# Patient Record
Sex: Female | Born: 1945 | Race: White | Hispanic: No | Marital: Married | Smoking: Never smoker
Health system: Southern US, Community
[De-identification: ages and names within clinical notes are randomized; demographics above are authoritative.]

## PROBLEM LIST (undated history)

## (undated) DIAGNOSIS — N6019 Diffuse cystic mastopathy of unspecified breast: Secondary | ICD-10-CM

## (undated) DIAGNOSIS — M810 Age-related osteoporosis without current pathological fracture: Secondary | ICD-10-CM

## (undated) DIAGNOSIS — G629 Polyneuropathy, unspecified: Secondary | ICD-10-CM

## (undated) DIAGNOSIS — R195 Other fecal abnormalities: Secondary | ICD-10-CM

## (undated) DIAGNOSIS — E041 Nontoxic single thyroid nodule: Secondary | ICD-10-CM

## (undated) DIAGNOSIS — I8390 Asymptomatic varicose veins of unspecified lower extremity: Secondary | ICD-10-CM

## (undated) DIAGNOSIS — R3129 Other microscopic hematuria: Secondary | ICD-10-CM

## (undated) DIAGNOSIS — I776 Arteritis, unspecified: Secondary | ICD-10-CM

## (undated) DIAGNOSIS — L280 Lichen simplex chronicus: Secondary | ICD-10-CM

## (undated) DIAGNOSIS — I499 Cardiac arrhythmia, unspecified: Secondary | ICD-10-CM

## (undated) HISTORY — PX: VENOUS ABLATION: SHX2656

## (undated) HISTORY — PX: COLONOSCOPY: SHX174

## (undated) HISTORY — PX: OTHER SURGICAL HISTORY: SHX169

---

## 2004-06-04 ENCOUNTER — Ambulatory Visit: Payer: Self-pay | Admitting: Internal Medicine

## 2004-08-13 ENCOUNTER — Ambulatory Visit: Payer: Self-pay | Admitting: Gastroenterology

## 2004-08-14 ENCOUNTER — Ambulatory Visit: Payer: Self-pay | Admitting: Urology

## 2004-08-16 ENCOUNTER — Ambulatory Visit: Payer: Self-pay | Admitting: Urology

## 2004-08-26 ENCOUNTER — Ambulatory Visit: Payer: Self-pay | Admitting: Urology

## 2004-09-26 ENCOUNTER — Ambulatory Visit: Payer: Self-pay | Admitting: Urology

## 2005-06-26 ENCOUNTER — Ambulatory Visit: Payer: Self-pay | Admitting: Internal Medicine

## 2006-07-01 ENCOUNTER — Ambulatory Visit: Payer: Self-pay | Admitting: Internal Medicine

## 2007-07-13 ENCOUNTER — Ambulatory Visit: Payer: Self-pay | Admitting: Internal Medicine

## 2008-08-18 ENCOUNTER — Ambulatory Visit: Payer: Self-pay | Admitting: Internal Medicine

## 2009-08-20 ENCOUNTER — Ambulatory Visit: Payer: Self-pay | Admitting: Internal Medicine

## 2010-09-04 ENCOUNTER — Ambulatory Visit: Payer: Self-pay | Admitting: Internal Medicine

## 2011-09-05 ENCOUNTER — Ambulatory Visit: Payer: Self-pay | Admitting: Internal Medicine

## 2012-09-09 ENCOUNTER — Ambulatory Visit: Payer: Self-pay | Admitting: Internal Medicine

## 2013-09-22 ENCOUNTER — Ambulatory Visit: Payer: Self-pay | Admitting: Internal Medicine

## 2013-10-04 ENCOUNTER — Ambulatory Visit: Payer: Self-pay | Admitting: Internal Medicine

## 2014-01-10 ENCOUNTER — Ambulatory Visit: Payer: Self-pay | Admitting: Urology

## 2014-02-20 ENCOUNTER — Ambulatory Visit: Payer: Self-pay | Admitting: Urology

## 2014-12-07 ENCOUNTER — Other Ambulatory Visit: Payer: Self-pay | Admitting: Internal Medicine

## 2014-12-07 DIAGNOSIS — Z1231 Encounter for screening mammogram for malignant neoplasm of breast: Secondary | ICD-10-CM

## 2014-12-14 ENCOUNTER — Ambulatory Visit: Payer: Self-pay

## 2014-12-20 ENCOUNTER — Ambulatory Visit
Admission: RE | Admit: 2014-12-20 | Discharge: 2014-12-20 | Disposition: A | Discharge status: Home / Self Care | Payer: BC Managed Care – PPO | Source: Ambulatory Visit | Attending: Internal Medicine | Admitting: Internal Medicine

## 2014-12-20 DIAGNOSIS — Z1231 Encounter for screening mammogram for malignant neoplasm of breast: Secondary | ICD-10-CM | POA: Diagnosis not present

## 2015-05-17 ENCOUNTER — Encounter: Payer: Self-pay | Admitting: *Deleted

## 2015-05-18 ENCOUNTER — Encounter: Payer: Self-pay | Admitting: *Deleted

## 2015-05-18 ENCOUNTER — Ambulatory Visit: Payer: BC Managed Care – PPO | Admitting: Registered Nurse

## 2015-05-18 ENCOUNTER — Ambulatory Visit
Admission: RE | Admit: 2015-05-18 | Discharge: 2015-05-18 | Disposition: A | Discharge status: Home / Self Care | Payer: BC Managed Care – PPO | Source: Ambulatory Visit | Attending: Gastroenterology | Admitting: Gastroenterology

## 2015-05-18 ENCOUNTER — Encounter
Admission: RE | Disposition: A | Discharge status: Home / Self Care | Payer: Self-pay | Source: Ambulatory Visit | Attending: Gastroenterology

## 2015-05-18 DIAGNOSIS — M810 Age-related osteoporosis without current pathological fracture: Secondary | ICD-10-CM | POA: Diagnosis not present

## 2015-05-18 DIAGNOSIS — Z79899 Other long term (current) drug therapy: Secondary | ICD-10-CM | POA: Diagnosis not present

## 2015-05-18 DIAGNOSIS — Z87442 Personal history of urinary calculi: Secondary | ICD-10-CM | POA: Diagnosis not present

## 2015-05-18 DIAGNOSIS — Z7982 Long term (current) use of aspirin: Secondary | ICD-10-CM | POA: Insufficient documentation

## 2015-05-18 DIAGNOSIS — Z1211 Encounter for screening for malignant neoplasm of colon: Secondary | ICD-10-CM | POA: Insufficient documentation

## 2015-05-18 HISTORY — DX: Diffuse cystic mastopathy of unspecified breast: N60.19

## 2015-05-18 HISTORY — DX: Age-related osteoporosis without current pathological fracture: M81.0

## 2015-05-18 HISTORY — DX: Lichen simplex chronicus: L28.0

## 2015-05-18 HISTORY — DX: Polyneuropathy, unspecified: G62.9

## 2015-05-18 HISTORY — DX: Other microscopic hematuria: R31.29

## 2015-05-18 HISTORY — DX: Arteritis, unspecified: I77.6

## 2015-05-18 HISTORY — PX: COLONOSCOPY WITH PROPOFOL: SHX5780

## 2015-05-18 HISTORY — DX: Asymptomatic varicose veins of unspecified lower extremity: I83.90

## 2015-05-18 HISTORY — DX: Other fecal abnormalities: R19.5

## 2015-05-18 SURGERY — COLONOSCOPY WITH PROPOFOL
Anesthesia: General

## 2015-05-18 MED ORDER — LIDOCAINE HCL (CARDIAC) 20 MG/ML IV SOLN
INTRAVENOUS | Status: DC | PRN
  Administered 2015-05-18: 40 mg via INTRAVENOUS

## 2015-05-18 MED ORDER — FENTANYL CITRATE (PF) 100 MCG/2ML IJ SOLN
INTRAMUSCULAR | Status: DC | PRN
  Administered 2015-05-18: 50 ug via INTRAVENOUS

## 2015-05-18 MED ORDER — PROPOFOL 10 MG/ML IV BOLUS
INTRAVENOUS | Status: DC | PRN
  Administered 2015-05-18: 50 mg via INTRAVENOUS

## 2015-05-18 MED ORDER — SODIUM CHLORIDE 0.9 % IV SOLN: INTRAVENOUS | Status: DC

## 2015-05-18 MED ORDER — SODIUM CHLORIDE 0.9 % IV SOLN
INTRAVENOUS | Status: DC
  Administered 2015-05-18: 08:00:00 via INTRAVENOUS

## 2015-05-18 MED ORDER — PROPOFOL 500 MG/50ML IV EMUL
INTRAVENOUS | Status: DC | PRN
  Administered 2015-05-18: 150 ug/kg/min via INTRAVENOUS

## 2015-05-18 MED ORDER — MIDAZOLAM HCL 2 MG/2ML IJ SOLN
INTRAMUSCULAR | Status: DC | PRN
  Administered 2015-05-18: 1 mg via INTRAVENOUS

## 2015-05-18 NOTE — Transfer of Care (Signed)
Immediate Anesthesia Transfer of Care Note  Patient: Megan Shepard  Procedure(s) Performed: Procedure(s): COLONOSCOPY WITH PROPOFOL (N/A)  Patient Location: PACU and Endoscopy Unit  Anesthesia Type:General  Level of Consciousness: sedated  Airway & Oxygen Therapy: Patient Spontanous Breathing and Patient connected to nasal cannula oxygen  Post-op Assessment: Report given to RN and Post -op Vital signs reviewed and stable  Post vital signs: Reviewed and stable  Last Vitals:  Filed Vitals:   05/18/15 0719 05/18/15 0825  BP: 133/62 92/59  Pulse: 65 64  Temp: 36.1 C 36.6 C  Resp: 14 13    Complications: No apparent anesthesia complications

## 2015-05-18 NOTE — Op Note (Signed)
[facility] Gastroenterology Patient Name: Megan Shepard Procedure Date: 05/18/2015 7:53 AM MRN: [id number] Account #: [id number] Date of Birth: [birth year] Admit Type: Outpatient Age: 70 Room: [facility] ENDO ROOM 4 Gender: Female Note Status: Finalized Procedure:            Colonoscopy Indications:          Screening for colorectal malignant neoplasm Providers:            Lupita Dawn. Candace Cruise, MD Referring MD:         Ramonita Lab, MD (Referring MD) Medicines:            Monitored Anesthesia Care Complications:        No immediate complications. Procedure:            Pre-Anesthesia Assessment:                       - Prior to the procedure, a History and Physical was                        performed, and patient medications, allergies and                        sensitivities were reviewed. The patient's tolerance of                        previous anesthesia was reviewed.                       - The risks and benefits of the procedure and the                        sedation options and risks were discussed with the                        patient. All questions were answered and informed                        consent was obtained.                       - After reviewing the risks and benefits, the patient                        was deemed in satisfactory condition to undergo the                        procedure.                       After obtaining informed consent, the colonoscope was                        passed under direct vision. Throughout the procedure,                        the patient's blood pressure, pulse, and oxygen                        saturations were monitored continuously. The  Colonoscope was introduced through the anus and                        advanced to the the cecum, identified by appendiceal                        orifice and ileocecal valve. The colonoscopy was                        performed without difficulty. The patient  tolerated the                        procedure well. The quality of the bowel preparation                        was good. Findings:      The colon (entire examined portion) appeared normal. Impression:           - The entire examined colon is normal.                       - No specimens collected. Recommendation:       - Discharge patient to home.                       - Repeat colonoscopy in 10 years for surveillance.                       - The findings and recommendations were discussed with                        the patient. Procedure Code(s):    --- Professional ---                       [phone], Colonoscopy, flexible; diagnostic, including                        collection of specimen(s) by brushing or washing, when                        performed (separate procedure) Diagnosis Code(s):    --- Professional ---                       Z12.11, Encounter for screening for malignant neoplasm                        of colon CPT copyright 2016 [employer]. All rights reserved. The codes documented in this report are preliminary and upon coder review may  be revised to meet current compliance requirements. Hulen Luster, MD 05/18/2015 8:22:05 AM This report has been signed electronically. Number of Addenda: 0 Note Initiated On: 05/18/2015 7:53 AM Scope Withdrawal Time: 0 hours 5 minutes 21 seconds  Total Procedure Duration: 0 hours 9 minutes 55 seconds       [facility]

## 2015-05-18 NOTE — Anesthesia Preprocedure Evaluation (Signed)
Anesthesia Evaluation  Patient identified by MRN, date of birth, ID band Patient awake    Reviewed: Allergy & Precautions, H&P , NPO status , Patient's Chart, lab work & pertinent test results, reviewed documented beta blocker date and time   Airway Mallampati: II   Neck ROM: full    Dental  (+) Teeth Intact   Pulmonary neg pulmonary ROS,    Pulmonary exam normal        Cardiovascular negative cardio ROS Normal cardiovascular exam     Neuro/Psych negative neurological ROS  negative psych ROS   GI/Hepatic negative GI ROS, Neg liver ROS,   Endo/Other  negative endocrine ROS  Renal/GU negative Renal ROS  negative genitourinary   Musculoskeletal   Abdominal   Peds  Hematology negative hematology ROS (+)   Anesthesia Other Findings Past Medical History:   Fibrocystic breast disease                                   Lichen simplex chronicus                                     Microscopic hematuria                                        Neuropathy (HCC)                                             Occult blood in stools                                       Osteoporosis                                                 Varicose vein                                                Vasculitis (HCC)                                           Past Surgical History:   COLONOSCOPY                                                   kidney stones                                                 VENOUS  ABLATION                                             BMI    Body Mass Index   22.26 kg/m 2     Reproductive/Obstetrics                             Anesthesia Physical Anesthesia Plan  ASA: III  Anesthesia Plan: General   Post-op Pain Management:    Induction:   Airway Management Planned:   Additional Equipment:   Intra-op Plan:   Post-operative Plan:   Informed Consent: I have reviewed the  patients History and Physical, chart, labs and discussed the procedure including the risks, benefits and alternatives for the proposed anesthesia with the patient or authorized representative who has indicated his/her understanding and acceptance.   Dental Advisory Given  Plan Discussed with: CRNA  Anesthesia Plan Comments:         Anesthesia Quick Evaluation

## 2015-05-18 NOTE — H&P (Signed)
    Primary Care Physician:  Adin Hector, MD Primary Gastroenterologist:  Dr. Candace Cruise  Pre-Procedure History & Physical: HPI:  Megan Shepard is a 70 y.o. female is here for an colonoscopy  Past Medical History  Diagnosis Date  . Fibrocystic breast disease   . Lichen simplex chronicus   . Microscopic hematuria   . Neuropathy ([place])   . Occult blood in stools   . Osteoporosis   . Varicose vein   . Vasculitis [facility])     Past Surgical History  Procedure Laterality Date  . Colonoscopy    . Kidney stones    . Venous ablation      Prior to Admission medications   Medication Sig Start Date End Date Taking? Authorizing Provider  acetaminophen (TYLENOL) 500 MG tablet Take 500 mg by mouth every 6 (six) hours as needed.   Yes Historical Provider, MD  aspirin 81 MG tablet Take 81 mg by mouth daily.   Yes Historical Provider, MD  calcium citrate-vitamin D (CITRACAL+D) 315-200 MG-UNIT tablet Take 1 tablet by mouth 2 (two) times daily.   Yes Historical Provider, MD  denosumab (PROLIA) 60 MG/ML SOLN injection Inject 60 mg into the skin every 6 (six) months. Administer in upper arm, thigh, or abdomen   Yes Historical Provider, MD  gabapentin (NEURONTIN) 100 MG capsule Take 100 mg by mouth 3 (three) times daily.   Yes Historical Provider, MD  magnesium oxide (MAG-OX) 400 MG tablet Take 400 mg by mouth daily.   Yes Historical Provider, MD  pentoxifylline (TRENTAL) 400 MG CR tablet Take 400 mg by mouth daily.   Yes Historical Provider, MD  vitamin C (ASCORBIC ACID) 500 MG tablet Take 1,000 mg by mouth 2 (two) times daily.   Yes Historical Provider, MD    Allergies as of 05/04/2015  . (Not on File)    Family History  Problem Relation Age of Onset  . Breast cancer Maternal Aunt     Social History   Social History  . Marital Status: Married    Spouse Name: N/A  . Number of Children: N/A  . Years of Education: N/A   Occupational History  . Not on file.   Social History Main Topics   . Smoking status: Never Smoker   . Smokeless tobacco: Never Used  . Alcohol Use: No  . Drug Use: No  . Sexual Activity: Not on file   Other Topics Concern  . Not on file   Social History Narrative    Review of Systems: See HPI, otherwise negative ROS  Physical Exam: BP 133/62 mmHg  Pulse 65  Temp(Src) 96.9 F (36.1 C) (Tympanic)  Resp 14  Ht 5' (1.524 m)  Wt 51.71 kg (114 lb)  BMI 22.26 kg/m2  SpO2 100%  LMP  (Approximate) General:   Alert,  pleasant and cooperative in NAD Head:  Normocephalic and atraumatic. Neck:  Supple; no masses or thyromegaly. Lungs:  Clear throughout to auscultation.    Heart:  Regular rate and rhythm. Abdomen:  Soft, nontender and nondistended. Normal bowel sounds, without guarding, and without rebound.   Neurologic:  Alert and  oriented x4;  grossly normal neurologically.  Impression/Plan: Megan Shepard is here for an colonoscopy to be performed for screening  Risks, benefits, limitations, and alternatives regarding  colonoscopy have been reviewed with the patient.  Questions have been answered.  All parties agreeable.   Jaselynn Tamas, Lupita Dawn, MD  05/18/2015, 8:05 AM

## 2015-05-18 NOTE — Anesthesia Procedure Notes (Signed)
Date/Time: 05/18/2015 7:53 AM Performed by: Doreen Salvage Pre-anesthesia Checklist: Patient identified, Emergency Drugs available, Suction available and Patient being monitored Patient Re-evaluated:Patient Re-evaluated prior to inductionOxygen Delivery Method: Nasal cannula Intubation Type: IV induction Dental Injury: Teeth and Oropharynx as per pre-operative assessment  Comments: Nasal cannula with etCO2 monitoring

## 2015-05-19 ENCOUNTER — Encounter: Payer: Self-pay | Admitting: Gastroenterology

## 2015-05-25 NOTE — Anesthesia Postprocedure Evaluation (Signed)
Anesthesia Post Note  Patient: Megan Shepard  Procedure(s) Performed: Procedure(s) (LRB): COLONOSCOPY WITH PROPOFOL (N/A)  Patient location during evaluation: PACU Anesthesia Type: General Level of consciousness: awake and alert Pain management: pain level controlled Vital Signs Assessment: post-procedure vital signs reviewed and stable Respiratory status: spontaneous breathing, nonlabored ventilation, respiratory function stable and patient connected to nasal cannula oxygen Cardiovascular status: blood pressure returned to baseline and stable Postop Assessment: no signs of nausea or vomiting Anesthetic complications: no    Last Vitals:  Filed Vitals:   05/18/15 0845 05/18/15 0855  BP: 100/67 106/65  Pulse: 64 59  Temp:    Resp: 15 14    Last Pain: There were no vitals filed for this visit.               Molli Barrows

## 2015-11-19 ENCOUNTER — Other Ambulatory Visit: Payer: Self-pay | Admitting: Internal Medicine

## 2015-11-19 DIAGNOSIS — Z1231 Encounter for screening mammogram for malignant neoplasm of breast: Secondary | ICD-10-CM

## 2015-12-10 DIAGNOSIS — N1832 Chronic kidney disease, stage 3b: Secondary | ICD-10-CM | POA: Insufficient documentation

## 2015-12-10 DIAGNOSIS — N183 Chronic kidney disease, stage 3 unspecified: Secondary | ICD-10-CM | POA: Insufficient documentation

## 2015-12-24 ENCOUNTER — Ambulatory Visit
Admission: RE | Admit: 2015-12-24 | Discharge: 2015-12-24 | Disposition: A | Discharge status: Home / Self Care | Payer: BC Managed Care – PPO | Source: Ambulatory Visit | Attending: Internal Medicine | Admitting: Internal Medicine

## 2015-12-24 DIAGNOSIS — Z1231 Encounter for screening mammogram for malignant neoplasm of breast: Secondary | ICD-10-CM

## 2016-11-19 ENCOUNTER — Other Ambulatory Visit: Payer: Self-pay | Admitting: Internal Medicine

## 2016-11-19 DIAGNOSIS — Z1231 Encounter for screening mammogram for malignant neoplasm of breast: Secondary | ICD-10-CM

## 2016-12-26 ENCOUNTER — Ambulatory Visit
Admission: RE | Admit: 2016-12-26 | Discharge: 2016-12-26 | Disposition: A | Discharge status: Home / Self Care | Payer: Medicare Other | Source: Ambulatory Visit | Attending: Internal Medicine | Admitting: Internal Medicine

## 2016-12-26 DIAGNOSIS — Z1231 Encounter for screening mammogram for malignant neoplasm of breast: Secondary | ICD-10-CM | POA: Diagnosis present

## 2017-04-16 ENCOUNTER — Other Ambulatory Visit: Payer: Self-pay

## 2017-04-16 ENCOUNTER — Emergency Department: Payer: Medicare Other

## 2017-04-16 ENCOUNTER — Emergency Department
Admission: EM | Admit: 2017-04-16 | Discharge: 2017-04-16 | Disposition: A | Discharge status: Home / Self Care | Payer: Medicare Other | Attending: Student in an Organized Health Care Education/Training Program | Admitting: Student in an Organized Health Care Education/Training Program

## 2017-04-16 ENCOUNTER — Encounter: Payer: Self-pay | Admitting: *Deleted

## 2017-04-16 DIAGNOSIS — R51 Headache: Secondary | ICD-10-CM | POA: Insufficient documentation

## 2017-04-16 DIAGNOSIS — R519 Headache, unspecified: Secondary | ICD-10-CM

## 2017-04-16 DIAGNOSIS — Z79899 Other long term (current) drug therapy: Secondary | ICD-10-CM | POA: Insufficient documentation

## 2017-04-16 LAB — CBC WITH DIFFERENTIAL/PLATELET
Basophils Absolute: 0.1 10*3/uL (ref 0–0.1)
Basophils Relative: 1 %
EOS ABS: 0.2 10*3/uL (ref 0–0.7)
EOS PCT: 3 %
HCT: 44.2 % (ref 35.0–47.0)
Hemoglobin: 15 g/dL (ref 12.0–16.0)
LYMPHS ABS: 1.2 10*3/uL (ref 1.0–3.6)
LYMPHS PCT: 24 %
MCH: 32 pg (ref 26.0–34.0)
MCHC: 33.9 g/dL (ref 32.0–36.0)
MCV: 94.5 fL (ref 80.0–100.0)
MONO ABS: 0.4 10*3/uL (ref 0.2–0.9)
MONOS PCT: 8 %
Neutro Abs: 3.2 10*3/uL (ref 1.4–6.5)
Neutrophils Relative %: 64 %
PLATELETS: 213 10*3/uL (ref 150–440)
RBC: 4.68 MIL/uL (ref 3.80–5.20)
RDW: 12.6 % (ref 11.5–14.5)
WBC: 5.1 10*3/uL (ref 3.6–11.0)

## 2017-04-16 LAB — BASIC METABOLIC PANEL
Anion gap: 9 (ref 5–15)
BUN: 26 mg/dL — AB (ref 6–20)
CALCIUM: 9 mg/dL (ref 8.9–10.3)
CO2: 24 mmol/L (ref 22–32)
CREATININE: 1.1 mg/dL — AB (ref 0.44–1.00)
Chloride: 109 mmol/L (ref 101–111)
GFR calc Af Amer: 58 mL/min — ABNORMAL LOW (ref >60)
GFR, EST NON AFRICAN AMERICAN: 50 mL/min — AB (ref >60)
GLUCOSE: 96 mg/dL (ref 65–99)
POTASSIUM: 3.6 mmol/L (ref 3.5–5.1)
SODIUM: 142 mmol/L (ref 135–145)

## 2017-04-16 LAB — SEDIMENTATION RATE: SED RATE: 8 mm/h (ref 0–30)

## 2017-04-16 MED ORDER — ACETAMINOPHEN 500 MG PO TABS: 1000.0000 mg | ORAL_TABLET | Freq: Once | ORAL | Status: DC

## 2017-04-16 MED ORDER — TRAMADOL HCL 50 MG PO TABS: 50.0000 mg | ORAL_TABLET | Freq: Four times a day (QID) | ORAL | 0 refills | Status: AC | PRN

## 2017-04-16 MED ORDER — ACYCLOVIR 400 MG PO TABS: 400.0000 mg | ORAL_TABLET | Freq: Every day | ORAL | 0 refills | Status: AC

## 2017-04-16 NOTE — ED Notes (Signed)
Pt denies any changes in vision, nausea/vomiting, or lightheaded/dizziness. Pt states pain started Monday and was more over the entire head. Yesterday pains seemed to become localized to the left posterior side of scalp. Pt denies any changes in sensation with palpation of effected area.

## 2017-04-16 NOTE — ED Provider Notes (Signed)
[facility] Emergency Department Provider Note    None    (approximate)  I have reviewed the triage vital signs and the nursing notes.   HISTORY  Chief Complaint Headache    HPI Megan Shepard is a 72 y.o. female presents with chief complaint of left-sided parietal headache that is intermittent and lancinating.  States that it lasts only seconds at a time.  No fevers.  No blurry vision numbness or tingling.  States that symptoms started on Sunday and have progressively coalesced into pain over the left parietal area.  Denies any trauma.  No neck stiffness.  Has never had symptoms like this before.  Does have a history of tension headache and states that this was not sudden or thunderclap headache.  Does have a history of neuropathy.  Past Medical History:  Diagnosis Date  . Fibrocystic breast disease   . Lichen simplex chronicus   . Microscopic hematuria   . Neuropathy   . Occult blood in stools   . Osteoporosis   . Varicose vein   . Vasculitis (HCC)    Family History  Problem Relation Age of Onset  . Breast cancer Maternal Aunt        80 's   Past Surgical History:  Procedure Laterality Date  . COLONOSCOPY    . COLONOSCOPY WITH PROPOFOL N/A 05/18/2015   Procedure: COLONOSCOPY WITH PROPOFOL;  Surgeon: Hulen Luster, MD;  Location: [facility] ENDOSCOPY;  Service: Gastroenterology;  Laterality: N/A;  . kidney stones    . VENOUS ABLATION     There are no active problems to display for this patient.     Prior to Admission medications   Medication Sig Start Date End Date Taking? Authorizing Provider  acetaminophen (TYLENOL) 500 MG tablet Take 500 mg by mouth every 6 (six) hours as needed.    [provider]  aspirin 81 MG tablet Take 81 mg by mouth daily.    [provider]  calcium citrate-vitamin D (CITRACAL+D) 315-200 MG-UNIT tablet Take 1 tablet by mouth 2 (two) times daily.    [provider]  denosumab (PROLIA) 60 MG/ML  SOLN injection Inject 60 mg into the skin every 6 (six) months. Administer in upper arm, thigh, or abdomen    [provider]  gabapentin (NEURONTIN) 100 MG capsule Take 100 mg by mouth 3 (three) times daily.    [provider]  magnesium oxide (MAG-OX) 400 MG tablet Take 400 mg by mouth daily.    [provider]  pentoxifylline (TRENTAL) 400 MG CR tablet Take 400 mg by mouth daily.    [provider]  vitamin C (ASCORBIC ACID) 500 MG tablet Take 1,000 mg by mouth 2 (two) times daily.    [provider]    Allergies Erythromycin    Social History Social History   Tobacco Use  . Smoking status: Never Smoker  . Smokeless tobacco: Never Used  Substance Use Topics  . Alcohol use: No  . Drug use: No    Review of Systems Patient denies headaches, rhinorrhea, blurry vision, numbness, shortness of breath, chest pain, edema, cough, abdominal pain, nausea, vomiting, diarrhea, dysuria, fevers, rashes or hallucinations unless otherwise stated above in HPI. ____________________________________________   PHYSICAL EXAM:  VITAL SIGNS: Vitals:   04/16/17 1809  BP: 138/70  Pulse: 67  Resp: 18  Temp: 98.3 F (36.8 C)  SpO2: 97%    Constitutional: Alert and oriented. Well appearing and in no acute distress. Eyes: Conjunctivae are  normal.  Head: Atraumatic. Nose: No congestion/rhinnorhea. Mouth/Throat: Mucous membranes are moist.   Neck: No stridor. Painless ROM.  Cardiovascular: Normal rate, regular rhythm. Grossly normal heart sounds.  Good peripheral circulation. Respiratory: Normal respiratory effort.  No retractions. Lungs CTAB. Gastrointestinal: Soft and nontender. No distention. No abdominal bruits. No CVA tenderness. Genitourinary:  Musculoskeletal: No lower extremity tenderness nor edema.  No joint effusions. Neurologic: CN- intact.  No facial droop, Normal FNF.  Normal heel to shin.  Sensation intact bilaterally. Normal speech and  language. No gross focal neurologic deficits are appreciated. No gait instability. Skin:  Skin is warm, dry and intact. No rash noted. Psychiatric: Mood and affect are normal. Speech and behavior are normal.  ____________________________________________   LABS (all labs ordered are listed, but only abnormal results are displayed)  Results for orders placed or performed during the hospital encounter of 04/16/17 (from the past 24 hour(s))  CBC with Differential/Platelet     Status: None   Collection Time: 04/16/17  7:44 PM  Result Value Ref Range   WBC 5.1 3.6 - 11.0 K/uL   RBC 4.68 3.80 - 5.20 MIL/uL   Hemoglobin 15.0 12.0 - 16.0 g/dL   HCT 44.2 35.0 - 47.0 %   MCV 94.5 80.0 - 100.0 fL   MCH 32.0 26.0 - 34.0 pg   MCHC 33.9 32.0 - 36.0 g/dL   RDW 12.6 11.5 - 14.5 %   Platelets 213 150 - 440 K/uL   Neutrophils Relative % 64 %   Neutro Abs 3.2 1.4 - 6.5 K/uL   Lymphocytes Relative 24 %   Lymphs Abs 1.2 1.0 - 3.6 K/uL   Monocytes Relative 8 %   Monocytes Absolute 0.4 0.2 - 0.9 K/uL   Eosinophils Relative 3 %   Eosinophils Absolute 0.2 0 - 0.7 K/uL   Basophils Relative 1 %   Basophils Absolute 0.1 0 - 0.1 K/uL  Basic metabolic panel     Status: Abnormal   Collection Time: 04/16/17  7:44 PM  Result Value Ref Range   Sodium 142 135 - 145 mmol/L   Potassium 3.6 3.5 - 5.1 mmol/L   Chloride 109 101 - 111 mmol/L   CO2 24 22 - 32 mmol/L   Glucose, Bld 96 65 - 99 mg/dL   BUN 26 (H) 6 - 20 mg/dL   Creatinine, Ser 1.10 (H) 0.44 - 1.00 mg/dL   Calcium 9.0 8.9 - 10.3 mg/dL   GFR calc non Af Amer 50 (L) >60 mL/min   GFR calc Af Amer 58 (L) >60 mL/min   Anion gap 9 5 - 15  Sedimentation rate     Status: None   Collection Time: 04/16/17  7:44 PM  Result Value Ref Range   Sed Rate 8 0 - 30 mm/hr   ____________________________________________ ____________________________________________  RADIOLOGY  I personally reviewed all radiographic images ordered to evaluate for the above acute  complaints and reviewed radiology reports and findings.  These findings were personally discussed with the patient.  Please see medical record for radiology report.  ____________________________________________   PROCEDURES  Procedure(s) performed:  Procedures    Critical Care performed: no ____________________________________________   INITIAL IMPRESSION / ASSESSMENT AND PLAN / ED COURSE  Pertinent labs & imaging results that were available during my care of the patient were reviewed by me and considered in my medical decision making (see chart for details).  DDX: neuralgia, GCA, shingles, mass, tension, migraine  Kissa Campoy is a 72 y.o. who presents to  the ED with with Hx of HA as described above. Not worst HA ever. Described as sudden, brief stabbing pains with complete resolution of symptoms between episodes. Denies focal neurologic symptoms. Denies trauma. No fevers or neck pain. No vision loss. Afebrile in ED. VSS. Exam as above. No meningeal signs. No CN, motor, sensory or cerebellar deficits. Temporal arteries palpable and non-tender. Appears well and non-toxic. No evidence of shingles or vesicles.  No clinical findings to suggest mening.  ESR suggests against GCA.  Not concistent with SAH.  Would expect pain to be persistent if SAH or aneurysmal.  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  Given her well appearance with no focal neuro deficits, stable to D/C home, follow up with PCP and Neurology if persistent recurrent Has.  Will given rx for shingles treatment incase lesions develop.  Have discussed with the patient and available family all diagnostics and treatments performed thus far and all questions were answered to the best of my ability. The patient demonstrates understanding and agreement with plan.        ____________________________________________   FINAL CLINICAL IMPRESSION(S) / ED DIAGNOSES  Final diagnoses:  Nonintractable episodic headache,  unspecified headache type      NEW MEDICATIONS STARTED DURING THIS VISIT:  New Prescriptions   No medications on file     Note:  This document was prepared using Dragon voice recognition software and may include unintentional dictation errors.    Megan Lot, MD 04/16/17 2056

## 2017-04-16 NOTE — Discharge Instructions (Signed)

## 2017-04-16 NOTE — ED Triage Notes (Signed)
Pt c/o headache x 4 days. Pt states pain is intermittent and stabbing/ Pt states scalp tenderness prior to today. Pt in no acute distress at this time, A&O x 4 and ambulatory to triage. Pt states that the pain is localized to L side of head. Pt has taken nothing for her pain.

## 2018-01-08 ENCOUNTER — Other Ambulatory Visit: Payer: Self-pay | Admitting: Internal Medicine

## 2018-01-08 DIAGNOSIS — Z1231 Encounter for screening mammogram for malignant neoplasm of breast: Secondary | ICD-10-CM

## 2018-01-14 ENCOUNTER — Ambulatory Visit
Admission: RE | Admit: 2018-01-14 | Discharge: 2018-01-14 | Disposition: A | Discharge status: Home / Self Care | Payer: Medicare Other | Source: Ambulatory Visit | Attending: Internal Medicine | Admitting: Internal Medicine

## 2018-01-14 DIAGNOSIS — Z1231 Encounter for screening mammogram for malignant neoplasm of breast: Secondary | ICD-10-CM | POA: Diagnosis not present

## 2018-02-04 ENCOUNTER — Other Ambulatory Visit: Payer: Self-pay | Admitting: Internal Medicine

## 2018-02-04 DIAGNOSIS — R311 Benign essential microscopic hematuria: Secondary | ICD-10-CM

## 2018-03-03 ENCOUNTER — Ambulatory Visit
Admission: RE | Admit: 2018-03-03 | Discharge: 2018-03-03 | Disposition: A | Discharge status: Home / Self Care | Payer: Medicare Other | Source: Ambulatory Visit | Attending: Internal Medicine | Admitting: Internal Medicine

## 2018-03-03 DIAGNOSIS — R311 Benign essential microscopic hematuria: Secondary | ICD-10-CM | POA: Diagnosis not present

## 2018-09-15 ENCOUNTER — Other Ambulatory Visit: Payer: Self-pay

## 2018-09-15 ENCOUNTER — Ambulatory Visit (INDEPENDENT_AMBULATORY_CARE_PROVIDER_SITE_OTHER): Payer: Medicare Other | Admitting: General Surgery

## 2018-09-15 ENCOUNTER — Encounter: Payer: Self-pay | Admitting: *Deleted

## 2018-09-15 ENCOUNTER — Encounter: Payer: Self-pay | Admitting: General Surgery

## 2018-09-15 VITALS — BP 137/83 | HR 77 | Temp 97.9°F | Resp 14 | Ht 59.0 in | Wt 111.0 lb

## 2018-09-15 DIAGNOSIS — L959 Vasculitis limited to the skin, unspecified: Secondary | ICD-10-CM | POA: Insufficient documentation

## 2018-09-15 DIAGNOSIS — M810 Age-related osteoporosis without current pathological fracture: Secondary | ICD-10-CM | POA: Insufficient documentation

## 2018-09-15 DIAGNOSIS — G5790 Unspecified mononeuropathy of unspecified lower limb: Secondary | ICD-10-CM | POA: Insufficient documentation

## 2018-09-15 DIAGNOSIS — L280 Lichen simplex chronicus: Secondary | ICD-10-CM | POA: Insufficient documentation

## 2018-09-15 DIAGNOSIS — R600 Localized edema: Secondary | ICD-10-CM | POA: Insufficient documentation

## 2018-09-15 DIAGNOSIS — E041 Nontoxic single thyroid nodule: Secondary | ICD-10-CM

## 2018-09-15 DIAGNOSIS — R3129 Other microscopic hematuria: Secondary | ICD-10-CM | POA: Insufficient documentation

## 2018-09-15 DIAGNOSIS — N6019 Diffuse cystic mastopathy of unspecified breast: Secondary | ICD-10-CM | POA: Insufficient documentation

## 2018-09-15 NOTE — H&P (View-Only) (Signed)
Patient ID: Megan Shepard, female   DOB: 06/17/1945, 73 y.o.   MRN: [id number]  Chief Complaint  Patient presents with  . New Patient (Initial Visit)    Thyroid    HPI Megan Shepard is a 73 y.o. female.   She was referred by Dr. Melissa Solum for evaluation of a suspicious left thyroid nodule.  Megan Shepard was followed by Dr. Solum for her osteoporosis.  On physical examination during a clinic visit, a left-sided thyroid nodule was appreciated.  Subsequent ultrasound and fine-needle aspiration biopsy were performed.  The cytopathology of the nodule was consistent with suspicious for follicular neoplasm.  Affirma testing was done and it was found to be suspicious but negative for BRAF or RET/PTC mutations.  She has been referred to discuss thyroid lobectomy.   Megan Shepard states that she would not have been aware of the nodule, had it not been found on exam.  She denies any voice changes, difficulty swallowing, frequent throat clearing, or pressure sensation in the neck while supine.  She does endorse hand tremors, but states that they are not always present.  She denies heat or cold intolerance.  She does not have palpitations.  There have been no changes in the texture of her hair, skin, or fingernails.  She describes chronic constipation.  She has not had any significant weight changes.  She has no history of occupational exposure to ionizing radiation nor any therapeutic head or neck irradiation.  She says that her mother took thyroid hormone replacement, but she is not aware as to why.  She thinks her daughter may be hyperthyroid, but is not aware of a specific diagnosis.  There is no other family history of thyroid cancer or other thyroid abnormalities.   Past Medical History:  Diagnosis Date  . Fibrocystic breast disease   . Lichen simplex chronicus   . Microscopic hematuria   . Neuropathy   . Occult blood in stools   . Osteoporosis   . Varicose vein   . Vasculitis (HCC)      Past Surgical History:  Procedure Laterality Date  . COLONOSCOPY    . COLONOSCOPY WITH PROPOFOL N/A 05/18/2015   Procedure: COLONOSCOPY WITH PROPOFOL;  Surgeon: Paul Y Oh, MD;  Location: [facility] ENDOSCOPY;  Service: Gastroenterology;  Laterality: N/A;  . kidney stones    . VENOUS ABLATION      Family History  Problem Relation Age of Onset  . Breast cancer Maternal Aunt        80's    Social History Social History   Tobacco Use  . Smoking status: Never Smoker  . Smokeless tobacco: Never Used  Substance Use Topics  . Alcohol use: No  . Drug use: No    Allergies  Allergen Reactions  . Erythromycin     Current Outpatient Medications  Medication Sig Dispense Refill  . alendronate (FOSAMAX) 70 MG tablet Take 70 mg by mouth once a week. Take with a full glass of water on an empty stomach.    . aspirin 81 MG tablet Take 81 mg by mouth daily.    . calcium citrate-vitamin D (CITRACAL+D) 315-200 MG-UNIT tablet Take 1 tablet by mouth 2 (two) times daily.    . magnesium oxide (MAG-OX) 400 MG tablet Take 400 mg by mouth daily.    . vitamin C (ASCORBIC ACID) 500 MG tablet Take 1,000 mg by mouth 2 (two) times daily.    . acetaminophen (TYLENOL) 500 MG tablet Take 500 mg by mouth every   6 (six) hours as needed.     No current facility-administered medications for this visit.     Review of Systems Review of Systems  All other systems reviewed and are negative.   Blood pressure 137/83, pulse 77, temperature 97.9 F (36.6 C), temperature source Temporal, resp. rate 14, height 4' 11" (1.499 m), weight 111 lb (50.3 kg), SpO2 99 %.  Physical Exam Physical Exam Constitutional:      General: She is not in acute distress.    Appearance: Normal appearance.  HENT:     Head: Normocephalic and atraumatic.     Nose:     Comments: Covered with a mask secondary to COVID-19 precautions    Mouth/Throat:     Comments: Covered with a mask secondary to COVID-19 precautions Eyes:      General: No scleral icterus.       Right eye: No discharge.        Left eye: No discharge.     Extraocular Movements: Extraocular movements intact.     Comments: No proptosis or exophthalmos  Neck:     Musculoskeletal: Normal range of motion. No neck rigidity.     Comments: There is an approximately 2 cm nodule palpated in the left lobe of the thyroid.  It moves freely with deglutition. Cardiovascular:     Rate and Rhythm: Normal rate and regular rhythm.     Heart sounds: No murmur.  Pulmonary:     Effort: Pulmonary effort is normal.     Breath sounds: Normal breath sounds.  Abdominal:     General: Abdomen is flat. Bowel sounds are normal.     Palpations: Abdomen is soft.  Genitourinary:    Comments: Deferred Musculoskeletal:     Comments: There are skin changes on the bilateral ankles, right greater than left, consistent with her history of vascular disease.  Lymphadenopathy:     Cervical: No cervical adenopathy.  Skin:    General: Skin is warm and dry.  Neurological:     General: No focal deficit present.     Mental Status: She is alert.  Psychiatric:        Mood and Affect: Mood normal.        Behavior: Behavior normal.     Data Reviewed I reviewed the biopsy and molecular diagnostic testing results as discussed in the history of present illness.  Thyroid function tests were reviewed in care everywhere.  The most recent TSH value is normal at 1.259 as of July 26, 2018.  The results of a head and neck ultrasound performed on August 02, 2018 were also reviewed.  Unfortunately, the actual images are not available for my review.    Indication: Thyroid nodules  Comparison: None  Technique: Gray-scale and color Doppler images of the neck were obtained.  Findings: The right lobe of the thyroid measures 5.79 x 1.56 x 1.73 cm. The left lobe of the thyroid measures 5.47 x 1.97 x 2.22 cm. The isthmus measures 0.26 cm in AP depth.  Echotexture of the thyroid is  homogeneous.  Within the right lobe, there are four nodules: 0.70 x 0.36 x 0.59 cm Upper pole, cystic 1.24 x 0.71 x 0.81 cm Mid pole, hypoechoic 1.54 x 0.80 x 1.16 cm Mid pole, hypoechoic 1.28 x 0.73 x 0.64 cm Lower pole, hypoechoic  Within the left lobe, there are four nodules: 2.32 x 1.91 x 2.02 cm Upper pole, solid and hypoechoic 0.96 x 0.89 x 0.87 cm Mid pole lateral, cystic 0.45 x  0.43 x 0.67 cm Mid pole medial, hypoechoic 1.79 x 0.83 x 1.16 cm Lower pole, mixed solid and cystic  There are no significantly enlarged lymph nodes in the neck.  Impression: Homogeneous thyroid gland which is enlarged. There are multiple nodules  throughout the thyroid gland. This is consistent with a multinodular  goiter. There is a dominant left-sided 2.3 cm solid and hypoechoic nodule that  should be considered for biopsy. Consider surveillance in 6-12 months of  the remaining thyroid nodules.   Assessment This is a 72-year-old woman who had a thyroid nodule discovered on physical examination by her endocrinologist.  Based upon her cytopathology and molecular diagnostic testing, the lesion bears an approximate 50% risk of malignancy.  I have recommended that she undergo a left thyroid lobectomy.  I discussed the risks of the operation with her today.  These include, but are not limited to, bleeding, infection, need for thyroid hormone replacement, injury to the recurrent laryngeal nerve, postsurgical hematoma, need for additional surgery and/or treatment, as well as the small potential risk of hypocalcemia (very low secondary to unilateral procedure). She had a number of questions today, all of which were answered to her satisfaction.  Plan We will schedule her for a left thyroid lobectomy at the soonest available date.    Megan Shepard 09/15/2018, 2:30 PM   

## 2018-09-15 NOTE — Progress Notes (Signed)
Patient ID: Megan Shepard, female   DOB: 10-Jan-1946, 73 y.o.   MRN: [id number]  Chief Complaint  Patient presents with  . New Patient (Initial Visit)    Thyroid    HPI Megan Shepard is a 73 y.o. female.   She was referred by Dr. Lavone Orn for evaluation of a suspicious left thyroid nodule.  Megan Shepard was followed by Dr. Gabriel Carina for her osteoporosis.  On physical examination during a clinic visit, a left-sided thyroid nodule was appreciated.  Subsequent ultrasound and fine-needle aspiration biopsy were performed.  The cytopathology of the nodule was consistent with suspicious for follicular neoplasm.  Affirma testing was done and it was found to be suspicious but negative for BRAF or RET/PTC mutations.  She has been referred to discuss thyroid lobectomy.   Megan Shepard states that she would not have been aware of the nodule, had it not been found on exam.  She denies any voice changes, difficulty swallowing, frequent throat clearing, or pressure sensation in the neck while supine.  She does endorse hand tremors, but states that they are not always present.  She denies heat or cold intolerance.  She does not have palpitations.  There have been no changes in the texture of her hair, skin, or fingernails.  She describes chronic constipation.  She has not had any significant weight changes.  She has no history of occupational exposure to ionizing radiation nor any therapeutic head or neck irradiation.  She says that her mother took thyroid hormone replacement, but she is not aware as to why.  She thinks her daughter may be hyperthyroid, but is not aware of a specific diagnosis.  There is no other family history of thyroid cancer or other thyroid abnormalities.   Past Medical History:  Diagnosis Date  . Fibrocystic breast disease   . Lichen simplex chronicus   . Microscopic hematuria   . Neuropathy   . Occult blood in stools   . Osteoporosis   . Varicose vein   . Vasculitis [facility])      Past Surgical History:  Procedure Laterality Date  . COLONOSCOPY    . COLONOSCOPY WITH PROPOFOL N/A 05/18/2015   Procedure: COLONOSCOPY WITH PROPOFOL;  Surgeon: Hulen Luster, MD;  Location: [facility] ENDOSCOPY;  Service: Gastroenterology;  Laterality: N/A;  . kidney stones    . VENOUS ABLATION      Family History  Problem Relation Age of Onset  . Breast cancer Maternal Aunt        80's    Social History Social History   Tobacco Use  . Smoking status: Never Smoker  . Smokeless tobacco: Never Used  Substance Use Topics  . Alcohol use: No  . Drug use: No    Allergies  Allergen Reactions  . Erythromycin     Current Outpatient Medications  Medication Sig Dispense Refill  . alendronate (FOSAMAX) 70 MG tablet Take 70 mg by mouth once a week. Take with a full glass of water on an empty stomach.    Marland Kitchen aspirin 81 MG tablet Take 81 mg by mouth daily.    . calcium citrate-vitamin D (CITRACAL+D) 315-200 MG-UNIT tablet Take 1 tablet by mouth 2 (two) times daily.    . magnesium oxide (MAG-OX) 400 MG tablet Take 400 mg by mouth daily.    . vitamin C (ASCORBIC ACID) 500 MG tablet Take 1,000 mg by mouth 2 (two) times daily.    Marland Kitchen acetaminophen (TYLENOL) 500 MG tablet Take 500 mg by mouth every  6 (six) hours as needed.     No current facility-administered medications for this visit.     Review of Systems Review of Systems  All other systems reviewed and are negative.   Blood pressure 137/83, pulse 77, temperature 97.9 F (36.6 C), temperature source Temporal, resp. rate 14, height 4' 11" (1.499 m), weight 111 lb (50.3 kg), SpO2 99 %.  Physical Exam Physical Exam Constitutional:      General: She is not in acute distress.    Appearance: Normal appearance.  HENT:     Head: Normocephalic and atraumatic.     Nose:     Comments: Covered with a mask secondary to COVID-19 precautions    Mouth/Throat:     Comments: Covered with a mask secondary to COVID-19 precautions Eyes:      General: No scleral icterus.       Right eye: No discharge.        Left eye: No discharge.     Extraocular Movements: Extraocular movements intact.     Comments: No proptosis or exophthalmos  Neck:     Musculoskeletal: Normal range of motion. No neck rigidity.     Comments: There is an approximately 2 cm nodule palpated in the left lobe of the thyroid.  It moves freely with deglutition. Cardiovascular:     Rate and Rhythm: Normal rate and regular rhythm.     Heart sounds: No murmur.  Pulmonary:     Effort: Pulmonary effort is normal.     Breath sounds: Normal breath sounds.  Abdominal:     General: Abdomen is flat. Bowel sounds are normal.     Palpations: Abdomen is soft.  Genitourinary:    Comments: Deferred Musculoskeletal:     Comments: There are skin changes on the bilateral ankles, right greater than left, consistent with her history of vascular disease.  Lymphadenopathy:     Cervical: No cervical adenopathy.  Skin:    General: Skin is warm and dry.  Neurological:     General: No focal deficit present.     Mental Status: She is alert.  Psychiatric:        Mood and Affect: Mood normal.        Behavior: Behavior normal.     Data Reviewed I reviewed the biopsy and molecular diagnostic testing results as discussed in the history of present illness.  Thyroid function tests were reviewed in care everywhere.  The most recent TSH value is normal at 1.259 as of July 26, 2018.  The results of a head and neck ultrasound performed on August 02, 2018 were also reviewed.  Unfortunately, the actual images are not available for my review.    Indication: Thyroid nodules  Comparison: None  Technique: Gray-scale and color Doppler images of the neck were obtained.  Findings: The right lobe of the thyroid measures 5.79 x 1.56 x 1.73 cm. The left lobe of the thyroid measures 5.47 x 1.97 x 2.22 cm. The isthmus measures 0.26 cm in AP depth.  Echotexture of the thyroid is  homogeneous.  Within the right lobe, there are four nodules: 0.70 x 0.36 x 0.59 cm Upper pole, cystic 1.24 x 0.71 x 0.81 cm Mid pole, hypoechoic 1.54 x 0.80 x 1.16 cm Mid pole, hypoechoic 1.28 x 0.73 x 0.64 cm Lower pole, hypoechoic  Within the left lobe, there are four nodules: 2.32 x 1.91 x 2.02 cm Upper pole, solid and hypoechoic 0.96 x 0.89 x 0.87 cm Mid pole lateral, cystic 0.45 x  0.43 x 0.67 cm Mid pole medial, hypoechoic 1.79 x 0.83 x 1.16 cm Lower pole, mixed solid and cystic  There are no significantly enlarged lymph nodes in the neck.  Impression: Homogeneous thyroid gland which is enlarged. There are multiple nodules  throughout the thyroid gland. This is consistent with a multinodular  goiter. There is a dominant left-sided 2.3 cm solid and hypoechoic nodule that  should be considered for biopsy. Consider surveillance in 6-12 months of  the remaining thyroid nodules.   Assessment This is a 73 year old woman who had a thyroid nodule discovered on physical examination by her endocrinologist.  Based upon her cytopathology and molecular diagnostic testing, the lesion bears an approximate 50% risk of malignancy.  I have recommended that she undergo a left thyroid lobectomy.  I discussed the risks of the operation with her today.  These include, but are not limited to, bleeding, infection, need for thyroid hormone replacement, injury to the recurrent laryngeal nerve, postsurgical hematoma, need for additional surgery and/or treatment, as well as the small potential risk of hypocalcemia (very low secondary to unilateral procedure). She had a number of questions today, all of which were answered to her satisfaction.  Plan We will schedule her for a left thyroid lobectomy at the soonest available date.    Fredirick Maudlin 09/15/2018, 2:30 PM

## 2018-09-15 NOTE — Progress Notes (Signed)
Patient's surgery to be scheduled for 10-11-18 at [facility] with Dr. Celine Ahr. Dr. Nestor Lewandowsky will be assisting with this case.   The patient is aware to have COVID-19 testing done on 10-07-18 at the [place] building drive thru ([address] [place]) between 8:00 am and 10:30 am. She is aware to isolate after, have no visitors, wash hands frequently, and avoid touching face.   The patient is aware she will be contacted by the [facility] to complete a phone interview sometime in the near future.  Patient aware to be NPO after midnight and have a driver.   She is aware to check in at the [facility] entrance where she will be screened for the coronavirus and then sent to Same Day Surgery.   The patient verbalizes understanding of the above.   The patient is aware to call the office should she have further questions.

## 2018-09-15 NOTE — Addendum Note (Signed)
Addended by: Fredirick Maudlin on: 09/15/2018 03:12 PM   Modules accepted: Orders, SmartSet

## 2018-09-15 NOTE — Patient Instructions (Signed)
Please call with any questions or concerns.  We will schedule your surgery.

## 2018-10-04 ENCOUNTER — Encounter
Admission: RE | Admit: 2018-10-04 | Discharge: 2018-10-04 | Disposition: A | Discharge status: Home / Self Care | Payer: Medicare Other | Source: Ambulatory Visit | Attending: General Surgery | Admitting: General Surgery

## 2018-10-04 ENCOUNTER — Encounter: Payer: Self-pay | Admitting: *Deleted

## 2018-10-04 ENCOUNTER — Other Ambulatory Visit: Payer: Self-pay

## 2018-10-04 MED ORDER — CHLORHEXIDINE GLUCONATE CLOTH 2 % EX PADS: 6.0000 | MEDICATED_PAD | Freq: Once | CUTANEOUS | Status: DC

## 2018-10-04 NOTE — Patient Instructions (Signed)
Your procedure is scheduled on: 10/11/2018 Mon Report to Same Day Surgery 2nd floor medical mall [facility] Entrance-take elevator on left to 2nd floor.  Check in with surgery information desk.) To find out your arrival time please call [phone] between 1PM - 3PM on 10/08/2018 Fri  Remember: Instructions that are not followed completely may result in serious medical risk, up to and including death, or upon the discretion of your surgeon and anesthesiologist your surgery may need to be rescheduled.    _x___ 1. Do not eat food after midnight the night before your procedure. You may drink clear liquids up to 2 hours before you are scheduled to arrive at the hospital for your procedure.  Do not drink clear liquids within 2 hours of your scheduled arrival to the hospital.  Clear liquids include  --Water or Apple juice without pulp  --Clear carbohydrate beverage such as ClearFast or Gatorade  --Black Coffee or Clear Tea (No milk, no creamers, do not add anything to                  the coffee or Tea Type 1 and type 2 diabetics should only drink water.   ____Ensure clear carbohydrate drink on the way to the hospital for bariatric patients  ____Ensure clear carbohydrate drink 3 hours before surgery.   No gum chewing or hard candies.     __x__ 2. No Alcohol for 24 hours before or after surgery.   __x__3. No Smoking or e-cigarettes for 24 prior to surgery.  Do not use any chewable tobacco products for at least 6 hour prior to surgery   ____  4. Bring all medications with you on the day of surgery if instructed.    __x__ 5. Notify your doctor if there is any change in your medical condition     (cold, fever, infections).    x___6. On the morning of surgery brush your teeth with toothpaste and water.  You may rinse your mouth with mouth wash if you wish.  Do not swallow any toothpaste or mouthwash.   Do not wear jewelry, make-up, hairpins, clips or nail polish.  Do not wear lotions,  powders, or perfumes. You may wear deodorant.  Do not shave 48 hours prior to surgery. Men may shave face and neck.  Do not bring valuables to the hospital.    [facility] is not responsible for any belongings or valuables.               Contacts, dentures or bridgework may not be worn into surgery.  Leave your suitcase in the car. After surgery it may be brought to your room.  For patients admitted to the hospital, discharge time is determined by your                       treatment team.  _  Patients discharged the day of surgery will not be allowed to drive home.  You will need someone to drive you home and stay with you the night of your procedure.    Please read over the following fact sheets that you were given:   [facility] Preparing for Surgery and or MRSA Information   _x___ Take anti-hypertensive listed below, cardiac, seizure, asthma,     anti-reflux and psychiatric medicines. These include:  1. None  2.  3.  4.  5.  6.  ____Fleets enema or Magnesium Citrate as directed.   _x___ Use CHG Soap or  sage wipes as directed on instruction sheet   ____ Use inhalers on the day of surgery and bring to hospital day of surgery  ____ Stop Metformin and Janumet 2 days prior to surgery.    ____ Take 1/2 of usual insulin dose the night before surgery and none on the morning     surgery.   _x___ Follow recommendations from Cardiologist, Pulmonologist or PCP regarding          stopping Aspirin, Coumadin, Plavix ,Eliquis, Effient, or Pradaxa, and Pletal.  X____Stop Anti-inflammatories such as Advil, Aleve, Ibuprofen, Motrin, Naproxen, Naprosyn, Goodies powders or aspirin products. OK to take Tylenol and                          Celebrex.   _x___ Stop supplements until after surgery.  But may continue Vitamin D, Vitamin B,       and multivitamin.   ____ Bring C-Pap to the hospital.

## 2018-10-04 NOTE — Patient Instructions (Signed)
Your procedure is scheduled on: 10/11/2018 Mon Report to Same Day Surgery 2nd floor medical mall [facility] Entrance-take elevator on left to 2nd floor.  Check in with surgery information desk.) To find out your arrival time please call [phone] between 1PM - 3PM on 10/08/2018 Fri  Remember: Instructions that are not followed completely may result in serious medical risk, up to and including death, or upon the discretion of your surgeon and anesthesiologist your surgery may need to be rescheduled.    _x___ 1. Do not eat food after midnight the night before your procedure. You may drink clear liquids up to 2 hours before you are scheduled to arrive at the hospital for your procedure.  Do not drink clear liquids within 2 hours of your scheduled arrival to the hospital.  Clear liquids include  --Water or Apple juice without pulp  --Clear carbohydrate beverage such as ClearFast or Gatorade  --Black Coffee or Clear Tea (No milk, no creamers, do not add anything to                  the coffee or Tea Type 1 and type 2 diabetics should only drink water.   ____Ensure clear carbohydrate drink on the way to the hospital for bariatric patients  ____Ensure clear carbohydrate drink 3 hours before surgery.   No gum chewing or hard candies.     __x__ 2. No Alcohol for 24 hours before or after surgery.   __x__3. No Smoking or e-cigarettes for 24 prior to surgery.  Do not use any chewable tobacco products for at least 6 hour prior to surgery   ____  4. Bring all medications with you on the day of surgery if instructed.    __x__ 5. Notify your doctor if there is any change in your medical condition     (cold, fever, infections).    x___6. On the morning of surgery brush your teeth with toothpaste and water.  You may rinse your mouth with mouth wash if you wish.  Do not swallow any toothpaste or mouthwash.   Do not wear jewelry, make-up, hairpins, clips or nail polish.  Do not wear lotions,  powders, or perfumes. You may wear deodorant.  Do not shave 48 hours prior to surgery. Men may shave face and neck.  Do not bring valuables to the hospital.    [facility] is not responsible for any belongings or valuables.               Contacts, dentures or bridgework may not be worn into surgery.  Leave your suitcase in the car. After surgery it may be brought to your room.  For patients admitted to the hospital, discharge time is determined by your                       treatment team.  _  Patients discharged the day of surgery will not be allowed to drive home.  You will need someone to drive you home and stay with you the night of your procedure.    Please read over the following fact sheets that you were given:   [facility] Preparing for Surgery and or MRSA Information   _x___ Take anti-hypertensive listed below, cardiac, seizure, asthma,     anti-reflux and psychiatric medicines. These include:  1. NOne  2.  3.  4.  5.  6.  ____Fleets enema or Magnesium Citrate as directed.   _x___ Use CHG Soap or  sage wipes as directed on instruction sheet   ____ Use inhalers on the day of surgery and bring to hospital day of surgery  ____ Stop Metformin and Janumet 2 days prior to surgery.    ____ Take 1/2 of usual insulin dose the night before surgery and none on the morning     surgery.   _x___ Follow recommendations from Cardiologist, Pulmonologist or PCP regarding          stopping Aspirin, Coumadin, Plavix ,Eliquis, Effient, or Pradaxa, and Pletal.  X____Stop Anti-inflammatories such as Advil, Aleve, Ibuprofen, Motrin, Naproxen, Naprosyn, Goodies powders or aspirin products. OK to take Tylenol and                          Celebrex.   _x___ Stop supplements until after surgery.  But may continue Vitamin D, Vitamin B,       and multivitamin.   ____ Bring C-Pap to the hospital.

## 2018-10-07 ENCOUNTER — Encounter: Payer: Self-pay | Admitting: *Deleted

## 2018-10-07 ENCOUNTER — Encounter
Admission: RE | Admit: 2018-10-07 | Discharge: 2018-10-07 | Disposition: A | Discharge status: Home / Self Care | Payer: Medicare Other | Source: Ambulatory Visit | Attending: General Surgery | Admitting: General Surgery

## 2018-10-07 ENCOUNTER — Other Ambulatory Visit: Payer: Medicare Other

## 2018-10-07 ENCOUNTER — Other Ambulatory Visit: Payer: Self-pay

## 2018-10-07 DIAGNOSIS — Z01818 Encounter for other preprocedural examination: Secondary | ICD-10-CM | POA: Diagnosis not present

## 2018-10-07 DIAGNOSIS — Z20828 Contact with and (suspected) exposure to other viral communicable diseases: Secondary | ICD-10-CM | POA: Diagnosis not present

## 2018-10-07 LAB — SARS CORONAVIRUS 2 (TAT 6-24 HRS): SARS Coronavirus 2: NEGATIVE

## 2018-10-07 NOTE — Pre-Procedure Instructions (Signed)
REQUEST FOR MEDICAL CLEARANCE/EKG CALLED AND FAXED TO ELIZABETH AT [facility]. [facility] FAXED [place]

## 2018-10-07 NOTE — Progress Notes (Signed)
I did call Dr. Olin Pia office and speak with Lattie Haw.   She is going to send a message back to the nurse to make sure they received medical clearance request. Lattie Haw will have the nurse call our office back to follow up.   Patient is scheduled for surgery on Monday, 10-11-18 with Dr. Celine Ahr.

## 2018-10-07 NOTE — Progress Notes (Signed)
Per Judeen Hammans with Anesthesia, patient will need medical clearance prior to surgery with Dr. Celine Ahr scheduled for Monday, 10-11-18 from Dr. Ramonita Lab due to recent EKG.   Judeen Hammans states she has forwarded this to Dr. Olin Pia office and has also sent our office a copy as well.   Patient is scheduled for a left thyroid lobectomy with Dr. Celine Ahr on 10-11-18.  Note routed to Dr. Celine Ahr.

## 2018-10-08 NOTE — Progress Notes (Signed)
Medical clearance has been received and faxed over to Pre Admit testing.

## 2018-10-11 ENCOUNTER — Encounter
Admission: RE | Disposition: A | Discharge status: Home / Self Care | Payer: Self-pay | Source: Home / Self Care | Attending: General Surgery

## 2018-10-11 ENCOUNTER — Ambulatory Visit: Payer: Medicare Other | Admitting: Anesthesiology

## 2018-10-11 ENCOUNTER — Encounter: Payer: Self-pay | Admitting: *Deleted

## 2018-10-11 ENCOUNTER — Other Ambulatory Visit: Payer: Self-pay

## 2018-10-11 ENCOUNTER — Observation Stay
Admission: RE | Admit: 2018-10-11 | Discharge: 2018-10-12 | Disposition: A | Discharge status: Home / Self Care | Payer: Medicare Other | Attending: General Surgery | Admitting: General Surgery

## 2018-10-11 DIAGNOSIS — E041 Nontoxic single thyroid nodule: Secondary | ICD-10-CM | POA: Diagnosis not present

## 2018-10-11 DIAGNOSIS — K5909 Other constipation: Secondary | ICD-10-CM | POA: Insufficient documentation

## 2018-10-11 DIAGNOSIS — Z79899 Other long term (current) drug therapy: Secondary | ICD-10-CM | POA: Insufficient documentation

## 2018-10-11 DIAGNOSIS — M810 Age-related osteoporosis without current pathological fracture: Secondary | ICD-10-CM | POA: Insufficient documentation

## 2018-10-11 DIAGNOSIS — E890 Postprocedural hypothyroidism: Secondary | ICD-10-CM

## 2018-10-11 DIAGNOSIS — G629 Polyneuropathy, unspecified: Secondary | ICD-10-CM | POA: Insufficient documentation

## 2018-10-11 DIAGNOSIS — Z9889 Other specified postprocedural states: Secondary | ICD-10-CM

## 2018-10-11 DIAGNOSIS — I776 Arteritis, unspecified: Secondary | ICD-10-CM | POA: Diagnosis not present

## 2018-10-11 DIAGNOSIS — Z7982 Long term (current) use of aspirin: Secondary | ICD-10-CM | POA: Insufficient documentation

## 2018-10-11 DIAGNOSIS — D34 Benign neoplasm of thyroid gland: Secondary | ICD-10-CM | POA: Diagnosis not present

## 2018-10-11 HISTORY — PX: PARATHYROIDECTOMY: SHX19

## 2018-10-11 HISTORY — DX: Cardiac arrhythmia, unspecified: I49.9

## 2018-10-11 HISTORY — DX: Nontoxic single thyroid nodule: E04.1

## 2018-10-11 HISTORY — PX: THYROID LOBECTOMY: SHX420

## 2018-10-11 SURGERY — LOBECTOMY, THYROID
Anesthesia: General | Laterality: Left

## 2018-10-11 MED ORDER — OXYCODONE HCL 5 MG/5ML PO SOLN: 5.0000 mg | Freq: Once | ORAL | Status: DC | PRN

## 2018-10-11 MED ORDER — PROPOFOL 10 MG/ML IV BOLUS
INTRAVENOUS | Status: AC
  Filled 2018-10-11: qty 60

## 2018-10-11 MED ORDER — MIDAZOLAM HCL 2 MG/2ML IJ SOLN
INTRAMUSCULAR | Status: AC
  Filled 2018-10-11: qty 2

## 2018-10-11 MED ORDER — PROPOFOL 10 MG/ML IV BOLUS
INTRAVENOUS | Status: AC
  Filled 2018-10-11: qty 40

## 2018-10-11 MED ORDER — LIDOCAINE HCL (CARDIAC) PF 100 MG/5ML IV SOSY
PREFILLED_SYRINGE | INTRAVENOUS | Status: DC | PRN
  Administered 2018-10-11: 60 mg via INTRAVENOUS

## 2018-10-11 MED ORDER — SODIUM CHLORIDE 0.9 % IV SOLN
INTRAVENOUS | Status: DC | PRN
  Administered 2018-10-11: 08:00:00 25 ug/min via INTRAVENOUS

## 2018-10-11 MED ORDER — EPHEDRINE SULFATE 50 MG/ML IJ SOLN
INTRAMUSCULAR | Status: DC | PRN
  Administered 2018-10-11 (×2): 10 mg via INTRAVENOUS

## 2018-10-11 MED ORDER — CELECOXIB 200 MG PO CAPS
200.0000 mg | ORAL_CAPSULE | ORAL | Status: AC
  Administered 2018-10-11: 200 mg via ORAL

## 2018-10-11 MED ORDER — VITAMIN C 500 MG PO TABS
500.0000 mg | ORAL_TABLET | Freq: Two times a day (BID) | ORAL | Status: DC
  Administered 2018-10-11 – 2018-10-12 (×3): 500 mg via ORAL
  Filled 2018-10-11 (×2): qty 1

## 2018-10-11 MED ORDER — SUCCINYLCHOLINE CHLORIDE 20 MG/ML IJ SOLN
INTRAMUSCULAR | Status: DC | PRN
  Administered 2018-10-11: 60 mg via INTRAVENOUS

## 2018-10-11 MED ORDER — MAGNESIUM OXIDE 400 (241.3 MG) MG PO TABS
400.0000 mg | ORAL_TABLET | Freq: Every day | ORAL | Status: DC
  Administered 2018-10-11 – 2018-10-12 (×2): 400 mg via ORAL
  Filled 2018-10-11 (×2): qty 1

## 2018-10-11 MED ORDER — LIDOCAINE HCL (PF) 2 % IJ SOLN
INTRAMUSCULAR | Status: AC
  Filled 2018-10-11: qty 40

## 2018-10-11 MED ORDER — ACETAMINOPHEN 500 MG PO TABS
1000.0000 mg | ORAL_TABLET | Freq: Four times a day (QID) | ORAL | Status: DC
  Administered 2018-10-11: 1000 mg via ORAL
  Filled 2018-10-11 (×2): qty 2

## 2018-10-11 MED ORDER — OXYCODONE HCL 5 MG PO TABS: 5.0000 mg | ORAL_TABLET | Freq: Once | ORAL | Status: DC | PRN

## 2018-10-11 MED ORDER — PROPOFOL 10 MG/ML IV BOLUS
INTRAVENOUS | Status: DC | PRN
  Administered 2018-10-11: 90 mg via INTRAVENOUS
  Administered 2018-10-11: 30 mg via INTRAVENOUS

## 2018-10-11 MED ORDER — MENTHOL 3 MG MT LOZG
1.0000 | LOZENGE | OROMUCOSAL | Status: DC | PRN
  Filled 2018-10-11: qty 9

## 2018-10-11 MED ORDER — FENTANYL CITRATE (PF) 100 MCG/2ML IJ SOLN
INTRAMUSCULAR | Status: AC
  Filled 2018-10-11: qty 4

## 2018-10-11 MED ORDER — REMIFENTANIL HCL 1 MG IV SOLR
INTRAVENOUS | Status: DC | PRN
  Administered 2018-10-11: .1 ug/kg/min via INTRAVENOUS

## 2018-10-11 MED ORDER — MAGNESIUM 500 MG PO TABS: 500.0000 mg | ORAL_TABLET | Freq: Every day | ORAL | Status: DC

## 2018-10-11 MED ORDER — GLYCOPYRROLATE 0.2 MG/ML IJ SOLN
INTRAMUSCULAR | Status: DC | PRN
  Administered 2018-10-11: 0.2 mg via INTRAVENOUS

## 2018-10-11 MED ORDER — FENTANYL CITRATE (PF) 100 MCG/2ML IJ SOLN: 25.0000 ug | INTRAMUSCULAR | Status: DC | PRN

## 2018-10-11 MED ORDER — GABAPENTIN 300 MG PO CAPS
ORAL_CAPSULE | ORAL | Status: AC
  Filled 2018-10-11: qty 1

## 2018-10-11 MED ORDER — CELECOXIB 200 MG PO CAPS
ORAL_CAPSULE | ORAL | Status: AC
  Filled 2018-10-11: qty 1

## 2018-10-11 MED ORDER — FENTANYL CITRATE (PF) 100 MCG/2ML IJ SOLN
INTRAMUSCULAR | Status: DC | PRN
  Administered 2018-10-11: 50 ug via INTRAVENOUS

## 2018-10-11 MED ORDER — GABAPENTIN 300 MG PO CAPS
300.0000 mg | ORAL_CAPSULE | ORAL | Status: AC
  Administered 2018-10-11: 300 mg via ORAL

## 2018-10-11 MED ORDER — DEXTROSE IN LACTATED RINGERS 5 % IV SOLN
INTRAVENOUS | Status: DC
  Administered 2018-10-11 (×2): via INTRAVENOUS

## 2018-10-11 MED ORDER — ONDANSETRON HCL 4 MG/2ML IJ SOLN
INTRAMUSCULAR | Status: DC | PRN
  Administered 2018-10-11 (×2): 4 mg via INTRAVENOUS

## 2018-10-11 MED ORDER — RISAQUAD PO CAPS
1.0000 | ORAL_CAPSULE | Freq: Two times a day (BID) | ORAL | Status: DC
  Administered 2018-10-11 – 2018-10-12 (×3): 1 via ORAL
  Filled 2018-10-11 (×3): qty 1

## 2018-10-11 MED ORDER — ONDANSETRON HCL 4 MG/2ML IJ SOLN
INTRAMUSCULAR | Status: AC
  Filled 2018-10-11: qty 10

## 2018-10-11 MED ORDER — VITAMIN D3 25 MCG (1000 UNIT) PO TABS
1000.0000 [IU] | ORAL_TABLET | Freq: Every day | ORAL | Status: DC
  Administered 2018-10-11 – 2018-10-12 (×2): 1000 [IU] via ORAL
  Filled 2018-10-11 (×4): qty 1

## 2018-10-11 MED ORDER — PHENYLEPHRINE HCL (PRESSORS) 10 MG/ML IV SOLN
INTRAVENOUS | Status: AC
  Filled 2018-10-11: qty 1

## 2018-10-11 MED ORDER — MIDAZOLAM HCL 2 MG/2ML IJ SOLN
INTRAMUSCULAR | Status: DC | PRN
  Administered 2018-10-11: 2 mg via INTRAVENOUS

## 2018-10-11 MED ORDER — REMIFENTANIL HCL 1 MG IV SOLR
INTRAVENOUS | Status: AC
  Filled 2018-10-11: qty 1000

## 2018-10-11 MED ORDER — ACETAMINOPHEN 500 MG PO TABS
1000.0000 mg | ORAL_TABLET | ORAL | Status: AC
  Administered 2018-10-11: 1000 mg via ORAL

## 2018-10-11 MED ORDER — EPHEDRINE SULFATE 50 MG/ML IJ SOLN
INTRAMUSCULAR | Status: AC
  Filled 2018-10-11: qty 2

## 2018-10-11 MED ORDER — FAMOTIDINE 20 MG PO TABS
ORAL_TABLET | ORAL | Status: AC
  Filled 2018-10-11: qty 1

## 2018-10-11 MED ORDER — DEXAMETHASONE SODIUM PHOSPHATE 10 MG/ML IJ SOLN
INTRAMUSCULAR | Status: DC | PRN
  Administered 2018-10-11: 10 mg via INTRAVENOUS

## 2018-10-11 MED ORDER — GLYCOPYRROLATE 0.2 MG/ML IJ SOLN
INTRAMUSCULAR | Status: AC
  Filled 2018-10-11: qty 5

## 2018-10-11 MED ORDER — LACTATED RINGERS IV SOLN
INTRAVENOUS | Status: DC
  Administered 2018-10-11: 07:00:00 via INTRAVENOUS

## 2018-10-11 MED ORDER — ROCURONIUM BROMIDE 50 MG/5ML IV SOLN
INTRAVENOUS | Status: AC
  Filled 2018-10-11: qty 3

## 2018-10-11 MED ORDER — ACETAMINOPHEN 500 MG PO TABS
ORAL_TABLET | ORAL | Status: AC
  Filled 2018-10-11: qty 2

## 2018-10-11 MED ORDER — CALCIUM CARBONATE-VITAMIN D 500-200 MG-UNIT PO TABS
1.0000 | ORAL_TABLET | Freq: Two times a day (BID) | ORAL | Status: DC
  Administered 2018-10-11 – 2018-10-12 (×3): 1 via ORAL
  Filled 2018-10-11 (×2): qty 1

## 2018-10-11 MED ORDER — SUCCINYLCHOLINE CHLORIDE 20 MG/ML IJ SOLN
INTRAMUSCULAR | Status: AC
  Filled 2018-10-11: qty 2

## 2018-10-11 MED ORDER — IBUPROFEN 400 MG PO TABS: 600.0000 mg | ORAL_TABLET | Freq: Four times a day (QID) | ORAL | Status: DC | PRN

## 2018-10-11 MED ORDER — HYDROCODONE-ACETAMINOPHEN 5-325 MG PO TABS: 1.0000 | ORAL_TABLET | ORAL | Status: DC | PRN

## 2018-10-11 MED ORDER — ONDANSETRON 4 MG PO TBDP: 4.0000 mg | ORAL_TABLET | Freq: Four times a day (QID) | ORAL | Status: DC | PRN

## 2018-10-11 MED ORDER — FAMOTIDINE 20 MG PO TABS
20.0000 mg | ORAL_TABLET | Freq: Once | ORAL | Status: AC
  Administered 2018-10-11: 06:00:00 20 mg via ORAL

## 2018-10-11 MED ORDER — ONDANSETRON HCL 4 MG/2ML IJ SOLN: 4.0000 mg | Freq: Four times a day (QID) | INTRAMUSCULAR | Status: DC | PRN

## 2018-10-11 SURGICAL SUPPLY — 40 items
BACTOSHIELD CHG 4% 4OZ (MISCELLANEOUS) ×1
BASIN GRAD PLASTIC 32OZ STRL (MISCELLANEOUS) ×3 IMPLANT
BLADE SURG 15 STRL LF DISP TIS (BLADE) ×1 IMPLANT
BLADE SURG 15 STRL SS (BLADE) ×1
CANISTER SUCT 1200ML W/VALVE (MISCELLANEOUS) ×2 IMPLANT
CLIP VESOCCLUDE SM WIDE 6/CT (CLIP) ×2 IMPLANT
COVER WAND RF STERILE (DRAPES) ×2 IMPLANT
DERMABOND ADVANCED (GAUZE/BANDAGES/DRESSINGS) ×1
DERMABOND ADVANCED .7 DNX12 (GAUZE/BANDAGES/DRESSINGS) ×1 IMPLANT
DRAPE MAG INST 16X20 L/F (DRAPES) ×2 IMPLANT
ELECT CAUTERY BLADE TIP 2.5 (TIP) ×2
ELECT REM PT RETURN 9FT ADLT (ELECTROSURGICAL) ×2
ELECTRODE CAUTERY BLDE TIP 2.5 (TIP) ×1 IMPLANT
ELECTRODE REM PT RTRN 9FT ADLT (ELECTROSURGICAL) ×1 IMPLANT
EVICEL AIRLESS SPRAY ACCES (MISCELLANEOUS) IMPLANT
GAUZE 4X4 16PLY RFD (DISPOSABLE) ×1 IMPLANT
GLOVE BIO SURGEON STRL SZ 6.5 (GLOVE) ×4 IMPLANT
GLOVE INDICATOR 7.0 STRL GRN (GLOVE) ×2 IMPLANT
GOWN STRL REUS W/ TWL LRG LVL3 (GOWN DISPOSABLE) ×2 IMPLANT
GOWN STRL REUS W/TWL LRG LVL3 (GOWN DISPOSABLE) ×2
KIT TURNOVER KIT A (KITS) ×2 IMPLANT
LABEL OR SOLS (LABEL) ×2 IMPLANT
NERVE STIMULATOR WAVEFORM 16S (NEUROSURGERY SUPPLIES) ×2
NS IRRIG 500ML POUR BTL (IV SOLUTION) ×3 IMPLANT
PACK HEAD/NECK (MISCELLANEOUS) ×2 IMPLANT
SCRUB CHG 4% DYNA-HEX 4OZ (MISCELLANEOUS) ×1 IMPLANT
SHEARS HARMONIC 9CM CVD (BLADE) ×1 IMPLANT
SPONGE KITTNER 5P (MISCELLANEOUS) ×2 IMPLANT
STIMULATOR NERVE WAVEFORM 16S (NEUROSURGERY SUPPLIES) IMPLANT
STRIP CLOSURE SKIN 1/2X4 (GAUZE/BANDAGES/DRESSINGS) ×2 IMPLANT
SURGICEL SNOW 2X4 (HEMOSTASIS) ×2 IMPLANT
SUT MNCRL AB 4-0 PS2 18 (SUTURE) ×1 IMPLANT
SUT PROLENE 4 0 PS 2 18 (SUTURE) ×2 IMPLANT
SUT SILK 2 0 (SUTURE) ×2
SUT SILK 2-0 18XBRD TIE 12 (SUTURE) ×1 IMPLANT
SUT SILK 2-0 30XBRD TIE 12 (SUTURE) ×1 IMPLANT
SUT VIC AB 4-0 RB1 27 (SUTURE) ×1
SUT VIC AB 4-0 RB1 27X BRD (SUTURE) IMPLANT
SUT VIC AB 4-0 SH 27 (SUTURE) ×1
SUT VIC AB 4-0 SH 27XANBCTRL (SUTURE) ×1 IMPLANT

## 2018-10-11 NOTE — Anesthesia Post-op Follow-up Note (Signed)
Anesthesia QCDR form completed.        

## 2018-10-11 NOTE — Op Note (Signed)
Operative Note  Preoperative Diagnosis:  an indeterminate thyroid biopsy  Postoperative Diagnosis:  an indeterminate thyroid biopsy  Operation:  Left Thyroid Lobectomy and Isthmusectomy; parathyroid autotransplant x1  Surgeon: Fredirick Maudlin, MD  Assistant: Nestor Lewandowsky, MD (a second surgeon was necessary for appropriate exposure and technical expertise, not found in any other available assistant.)  Anesthesia: General endotracheal  Findings: There was a dominant nodule in the left lobe of the thyroid, along with multiple other small nodules.  No concerning lymphadenopathy was visualized in the surgical field.  The recurrent laryngeal nerve was well visualized and gave an appropriate functional signal via the checkpoint nerve monitoring system.  The inferior parathyroid gland was preserved on a good pedicle, however the superior parathyroid gland was devascularized.  It was therefore autotransplanted into the left sternocleidomastoid muscle at the end of the case.  Indications: Megan Shepard is a 73 year old woman who was found to have a thyroid nodule on physical examination.  Subsequent ultrasound led to a biopsy.  The biopsy was indeterminant; however based upon molecular diagnostic testing, it was felt to be suspicious, with a 50% risk of malignancy.  She was referred for surgical evaluation.  Thyroid lobectomy was recommended.  The risks of the operation were discussed with her and she agreed to proceed.  Procedure In Detail: The patient was identified in the preoperative holding area and brought to the operating room where she was placed supine on the OR table.  Bony prominences were padded and bilateral sequential compression devices were placed on the lower extremities.  General endotracheal anesthesia was induced without incident.  The patient was positioned appropriately for the operation and sterilely prepped and draped in standard fashion.  A timeout was performed confirming the  patient's identity, the procedure being performed, her allergies, all necessary equipment was available, and that maintenance anesthesia was adequate.  A 4 cm transverse incision was made in a natural skin fold that was appropriately positioned for the operation.  This was carried down through the subcutaneous tissues and platysma using electrocautery.  Subplatysmal flaps were elevated and the strap muscles were divided in the median raphae.  We elevated the strap muscles off of the left lobe of the thyroid.  The superior pole vessels were sequentially isolated and divided with silk ties and the harmonic scalpel.  The thyroid was then rotated medially.  The middle thyroid vein was divided.  We opened the tracheoesophageal groove and identified the recurrent laryngeal nerve.  It was carefully dissected free from the surrounding tissues and visualized throughout its course in the neck.  Medial to the nerve, the trachea was exposed.  The inferior pole vessels were divided with silk ties and the harmonic scalpel.  The inferior parathyroid gland was identified and preserved on a good vascular pedicle.  We dissected the nerve up to its insertion point at the cricothyroid muscle.  The ligament of Berry and anterior suspensory ligament were divided.  The gland was dissected off the trachea and across the midline.  The thyroid parenchyma was divided with the harmonic scalpel at the right lateral aspect of the isthmus.  The gland was then removed and carefully inspected.  A parathyroid gland was found clinging to the thyroid tissue.  It was dissected off and placed in cold saline for autotransplantation at the end of the case.  We irrigated our wound bed and obtain good hemostasis.  A Valsalva maneuver from the anesthesia team confirmed no ongoing surgical bleeding.  We applied SNoW for additional hemostasis.  The cut edge of the thyroid was cauterized to minimize any oozing.  The strap muscles were closed in the midline  with running 4-0 Vicryl.  The preserve parathyroid tissue was brought into the field.  It was divided into small pieces which were crosshatched to increase their surface area.  We made small pockets in the left sternocleidomastoid muscle.  Parathyroid tissue was placed into each pocket.  Each pocket was then closed and marked with clips.  The platysma was reapproximated with interrupted Vicryl and the skin was closed with running subcuticular Prolene.  The skin was cleaned, and Dermabond and Steri-Strips were applied.  The Prolene suture was removed.  The patient was awakened, extubated, and taken to the postanesthesia care unit in good condition.  EBL: Less than 5 cc  IVF: Please see anesthesia record  Specimen(s): Left thyroid lobe and isthmus to pathology for permanent section  Complications: none immediately apparent.   Counts: all needles, instruments, and sponges were counted and reported to be correct in number at the end of the case.   I was present for and participated in the entire operation.  Fredirick Maudlin 9:09 AM

## 2018-10-11 NOTE — Anesthesia Preprocedure Evaluation (Addendum)
Anesthesia Evaluation  Patient identified by MRN, date of birth, ID band Patient awake    Reviewed: Allergy & Precautions, H&P , NPO status , Patient's Chart, lab work & pertinent test results  Airway Mallampati: II  TM Distance: >3 FB Neck ROM: full    Dental  (+) Teeth Intact   Pulmonary neg pulmonary ROS, neg shortness of breath, neg COPD, neg recent URI,           Cardiovascular (-) angina(-) Past MI, (-) Cardiac Stents and (-) CHF + dysrhythmias (PVC's)      Neuro/Psych negative neurological ROS  negative psych ROS   GI/Hepatic negative GI ROS, Neg liver ROS,   Endo/Other  negative endocrine ROS  Renal/GU CRFRenal disease     Musculoskeletal   Abdominal   Peds  Hematology negative hematology ROS (+)   Anesthesia Other Findings Past Medical History: No date: Dysrhythmia No date: Fibrocystic breast disease 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 No date: Microscopic hematuria No date: Neuropathy No date: Nodule of left lobe of thyroid gland No date: Occult blood in stools No date: Osteoporosis No date: Varicose vein No date: Vasculitis ([place])  Past Surgical History: No date: COLONOSCOPY 05/18/2015: COLONOSCOPY WITH PROPOFOL; N/A     Comment:  Procedure: COLONOSCOPY WITH PROPOFOL;  Surgeon: Hulen Luster, MD;  Location: [facility] ENDOSCOPY;  Service:               Gastroenterology;  Laterality: N/A; No date: kidney stones No date: VENOUS ABLATION  BMI    Body Mass Index: 21.68 kg/m      Reproductive/Obstetrics negative OB ROS                            Anesthesia Physical Anesthesia Plan  ASA: II  Anesthesia Plan: General ETT   Post-op Pain Management:    Induction:   PONV Risk Score and Plan: Ondansetron, Dexamethasone and Treatment may vary due to age or medical condition  Airway Management Planned:   Additional Equipment:   Intra-op Plan:   Post-operative  Plan:   Informed Consent: I have reviewed the patients History and Physical, chart, labs and discussed the procedure including the risks, benefits and alternatives for the proposed anesthesia with the patient or authorized representative who has indicated his/her understanding and acceptance.     Dental Advisory Given  Plan Discussed with: Anesthesiologist and CRNA  Anesthesia Plan Comments:        Anesthesia Quick Evaluation

## 2018-10-11 NOTE — Transfer of Care (Signed)
Immediate Anesthesia Transfer of Care Note  Patient: Megan Shepard  Procedure(s) Performed: THYROID LOBECTOMY, LEFT, (Left ) PARATHYROIDECTOMY AUTOTRANSPLANT (Left )  Patient Location: PACU  Anesthesia Type:General  Level of Consciousness: awake, alert , oriented and patient cooperative  Airway & Oxygen Therapy: Patient Spontanous Breathing and Patient connected to face mask oxygen  Post-op Assessment: Report given to RN and Post -op Vital signs reviewed and stable  Post vital signs: Reviewed and stable  Last Vitals:  Vitals Value Taken Time  BP 148/96 10/11/18 0903  Temp    Pulse 93 10/11/18 0905  Resp 14 10/11/18 0905  SpO2 100 % 10/11/18 0905  Vitals shown include unvalidated device data.  Last Pain:  Vitals:   10/11/18 0612  TempSrc: Tympanic  PainSc: 0-No pain         Complications: No apparent anesthesia complications

## 2018-10-11 NOTE — Anesthesia Postprocedure Evaluation (Signed)
Anesthesia Post Note  Patient: Megan Shepard  Procedure(s) Performed: THYROID LOBECTOMY, LEFT, (Left ) PARATHYROIDECTOMY AUTOTRANSPLANT (Left )  Patient location during evaluation: PACU Anesthesia Type: General Level of consciousness: awake and alert Pain management: pain level controlled Vital Signs Assessment: post-procedure vital signs reviewed and stable Respiratory status: spontaneous breathing, nonlabored ventilation and respiratory function stable Cardiovascular status: blood pressure returned to baseline and stable Postop Assessment: no apparent nausea or vomiting Anesthetic complications: no     Last Vitals:  Vitals:   10/11/18 1041 10/11/18 1127  BP: 129/81 120/70  Pulse: 69 70  Resp: 12   Temp: (!) 36.4 C   SpO2: 98% 100%    Last Pain:  Vitals:   10/11/18 1041  TempSrc: Oral  PainSc:                  Durenda Hurt

## 2018-10-11 NOTE — Anesthesia Procedure Notes (Signed)
Procedure Name: Intubation Performed by: Kelton Pillar, CRNA Pre-anesthesia Checklist: Patient identified, Emergency Drugs available, Suction available and Patient being monitored Patient Re-evaluated:Patient Re-evaluated prior to induction Oxygen Delivery Method: Circle system utilized Preoxygenation: Pre-oxygenation with 100% oxygen Induction Type: IV induction Ventilation: Mask ventilation without difficulty Laryngoscope Size: McGraph and 3 Grade View: Grade I Tube type: Oral Tube size: 6.0 mm Number of attempts: 1 Airway Equipment and Method: Stylet Placement Confirmation: ETT inserted through vocal cords under direct vision,  positive ETCO2,  CO2 detector and breath sounds checked- equal and bilateral Secured at: 21 cm Tube secured with: Tape Dental Injury: Teeth and Oropharynx as per pre-operative assessment

## 2018-10-11 NOTE — Interval H&P Note (Signed)
History and Physical Interval Note:  10/11/2018 7:10 AM  Megan Shepard  has presented today for surgery, with the diagnosis of D44.0 SUSPICIOUS THYROID NODULE.  The various methods of treatment have been discussed with the patient and family. After consideration of risks, benefits and other options for treatment, the patient has consented to  Procedure(s): THYROID LOBECTOMY, LEFT (Left) as a surgical intervention.  The patient's history has been reviewed, patient examined, no change in status, stable for surgery.  I have reviewed the patient's chart and labs.  Questions were answered to the patient's satisfaction.     Fredirick Maudlin

## 2018-10-11 NOTE — Progress Notes (Signed)
Pt in PACU awaiting to go to room 213. No cell phone number for husband, Megan Shepard, on Family member Contact Information sheet. Patient states husband does not have a cell phone but is in the waiting area.  Spoke with Trish at the front desk who will notify Megan Shepard that patient will be in room 213 in 15 minutes.

## 2018-10-12 DIAGNOSIS — D34 Benign neoplasm of thyroid gland: Secondary | ICD-10-CM | POA: Diagnosis not present

## 2018-10-12 LAB — SURGICAL PATHOLOGY

## 2018-10-12 MED ORDER — HYDROCODONE-ACETAMINOPHEN 5-325 MG PO TABS: 1.0000 | ORAL_TABLET | ORAL | 0 refills | Status: DC | PRN

## 2018-10-12 NOTE — Discharge Instructions (Signed)

## 2018-10-12 NOTE — Progress Notes (Signed)
Dorene Sorrow to be D/C'd home per MD order.  Discussed prescriptions and follow up appointments with the patient. Prescriptions given to patient, medication list explained in detail. Pt verbalized understanding.  Allergies as of 10/12/2018      Reactions   Erythromycin Rash      Medication List    TAKE these medications   alendronate 70 MG tablet Commonly known as: FOSAMAX Take 70 mg by mouth every Sunday. Take with a full glass of water on an empty stomach.   aspirin 81 MG tablet Take 81 mg by mouth 2 (two) times a week.   CALCIUM 600+D3 PO Take 1 tablet by mouth 2 (two) times daily.   HYDROcodone-acetaminophen 5-325 MG tablet Commonly known as: NORCO/VICODIN Take 1 tablet by mouth every 4 (four) hours as needed for moderate pain or severe pain.   Magnesium 500 MG Tabs Take 500 mg by mouth daily.   magnesium oxide 400 MG tablet Commonly known as: MAG-OX Take 400 mg by mouth daily.   PROBIOTIC PO Take 1 capsule by mouth 2 (two) times daily.   vitamin C 500 MG tablet Commonly known as: ASCORBIC ACID Take 500 mg by mouth 2 (two) times daily.   Vitamin D3 25 MCG (1000 UT) Caps Take 1,000 Units by mouth daily.       Vitals:   10/12/18 0437 10/12/18 0849  BP: 114/62 111/65  Pulse: 69 68  Resp: 16 16  Temp: 98.3 F (36.8 C) 98.7 F (37.1 C)  SpO2: 96% 98%    Skin clean, dry and intact without evidence of skin break down, no evidence of skin tears noted. IV catheter discontinued intact. Site without signs and symptoms of complications. Dressing and pressure applied. Pt denies pain at this time. No complaints noted.  An After Visit Summary was printed and given to the patient. Patient escorted via [place], and D/C home via private auto.  Chuck Hint RN [facility] [employer]

## 2018-10-12 NOTE — Care Management Obs Status (Signed)
[place] NOTIFICATION   Patient Details  Name: Megan Shepard MRN: [id number] Date of Birth: [birth year]   Medicare Observation Status Notification Given:  Yes    Candie Chroman, LCSW 10/12/2018, 9:35 AM

## 2018-10-12 NOTE — Discharge Summary (Addendum)
[facility] SURGICAL DISCHARGE SUMMARY  Patient ID: Megan Shepard MRN: [id number] DOB/AGE: August 04, 1945 73 y.o.  Admit date: 10/11/2018 Discharge date: 10/12/2018  Discharge Diagnoses Patient Active Problem List   Diagnosis Date Noted  . S/P partial thyroidectomy 10/11/2018  . Left thyroid nodule     Consultants None  Procedures 10/11/2018:  Partial Thyroidectomy   HPI: Megan Shepard is a 73 y.o. female with a history of suspicious left thyroid nodule who presents to [facility] on 10/11/2018 for scheduled partial thyroidectomy with Dr Celine Ahr.   Hospital Course: Informed consent was obtained and documented, and patient underwent uneventful partial thyroidectomy (left) (Dr Celine Ahr, 10/11/2018).  Post-operatively, patient did well and advancement of patient's diet and ambulation were well-tolerated. The remainder of patient's hospital course was essentially unremarkable, and discharge planning was initiated accordingly with patient safely able to be discharged home with appropriate discharge instructions, pain control, and outpatient follow-up after all of her questions were answered to her expressed satisfaction.   Discharge Condition: Good   Physical Examination:  Constitutional: Well appearing female, NAD Neck: Incision is CDI with steri-strips, no erythema, no drainage Pulmonary: Normal effort, no respiratory distress, no hoarseness to voice Skin: Warm, Dry    Allergies as of 10/12/2018      Reactions   Erythromycin Rash      Medication List    TAKE these medications   alendronate 70 MG tablet Commonly known as: FOSAMAX Take 70 mg by mouth every Sunday. Take with a full glass of water on an empty stomach.   aspirin 81 MG tablet Take 81 mg by mouth 2 (two) times a week.   CALCIUM 600+D3 PO Take 1 tablet by mouth 2 (two) times daily.   HYDROcodone-acetaminophen 5-325 MG tablet Commonly known as: NORCO/VICODIN Take 1 tablet by mouth every 4 (four)  hours as needed for moderate pain or severe pain.   Magnesium 500 MG Tabs Take 500 mg by mouth daily.   magnesium oxide 400 MG tablet Commonly known as: MAG-OX Take 400 mg by mouth daily.   PROBIOTIC PO Take 1 capsule by mouth 2 (two) times daily.   vitamin C 500 MG tablet Commonly known as: ASCORBIC ACID Take 500 mg by mouth 2 (two) times daily.   Vitamin D3 25 MCG (1000 UT) Caps Take 1,000 Units by mouth daily.        Follow-up Information    Fredirick Maudlin, MD. Schedule an appointment as soon as possible for a visit in 2 week(s).   Specialty: General Surgery Why: s/p partial thyroidectomy Contact information: [address] [place] [address] [phone]            Time spent on discharge management including discussion of hospital course, clinical condition, outpatient instructions, prescriptions, and follow up with the patient and members of the medical team: >30 minutes  -- Edison Simon , PA-C [facility]  10/12/2018, 9:38 AM [phone] M-F: 7am - 4pm   I saw and evaluated the patient.  I agree with the above documentation, exam, and plan, which I have edited where appropriate. Fredirick Maudlin  9:42 AM

## 2018-10-13 ENCOUNTER — Telehealth: Payer: Self-pay | Admitting: General Surgery

## 2018-10-13 NOTE — Telephone Encounter (Signed)
Discussed path w/pt. Benign. Will need labs in 6-8 weeks to assess residual thyroid function. Can be done by PCP or endocrinologist.

## 2018-10-28 ENCOUNTER — Ambulatory Visit (INDEPENDENT_AMBULATORY_CARE_PROVIDER_SITE_OTHER): Payer: Medicare Other | Admitting: General Surgery

## 2018-10-28 ENCOUNTER — Encounter: Payer: Self-pay | Admitting: General Surgery

## 2018-10-28 ENCOUNTER — Other Ambulatory Visit: Payer: Self-pay

## 2018-10-28 VITALS — BP 137/78 | HR 74 | Temp 97.7°F | Ht 59.5 in | Wt 111.0 lb

## 2018-10-28 DIAGNOSIS — Z9889 Other specified postprocedural states: Secondary | ICD-10-CM

## 2018-10-28 DIAGNOSIS — E890 Postprocedural hypothyroidism: Secondary | ICD-10-CM

## 2018-10-28 NOTE — Progress Notes (Signed)
Megan Shepard is here today for a postoperative visit after undergoing a left thyroid lobectomy for a suspicious thyroid nodule.  Fortunately, her pathology was benign:  Surgical Pathology  CASE: [id number]  PATIENT: Megan Shepard  Surgical Pathology Report      SPECIMEN SUBMITTED:  A. Thyroid, left lobe and isthmus   CLINICAL HISTORY:  None provided   PRE-OPERATIVE DIAGNOSIS:  D44.0 suspicious thyroid nodule   POST-OPERATIVE DIAGNOSIS:  Suspicious thyroid nodule      DIAGNOSIS:  A. THYROID, LEFT LOBE AND ISTHMUS, HEMITHYROIDECTOMY:  - FOLLICULAR ADENOMA.  - NEGATIVE FOR MALIGNANCY.   Comment:  Sections demonstrate a well-circumscribed, encapsulated lesion  characterized by tightly packed microfollicles. Small indistinct nuclei  are noted at high power. Focally, the lesional cells overlap. There is  no evidence of nuclear grooves, pseudoinclusions, cleared chromatin, or  capsular invasion.   She has done well since her operation.  She did not require any narcotic pain medication.  Her speaking voice and swallowing are normal, though she states that she is unable to sing high notes at this time.  Vitals:   10/28/18 0931  BP: 137/78  Pulse: 74  Temp: 97.7 F (36.5 C)  SpO2: 98%   Focused neck exam: Her transverse thyroidectomy incision is healing nicely.  There is no erythema, induration, or drainage present.  There is a slight healing ridge, most prominent at the right lateral aspect of the scar.  Impression and plan: Megan Shepard is a 73 year old woman who underwent an uncomplicated left thyroid lobectomy for a suspicious thyroid nodule.  Fortunately her pathology was benign.  She was reassured today that her singing voice should resume normal function in a few months, as the inflammation and irritation of the external branch of the superior laryngeal nerve resolves.  She was counseled today on scar care, including massage with an emollient agent  several times a day to minimize tethering and improve cosmesis, as well as avoidance of ultraviolet exposure for the next year.  She is scheduled to follow-up with Dr. Gabriel Carina in December; if she develops symptoms suggestive of hypo-thyroidism prior to that time, she should have labs done so that we can see if she needs any thyroid hormone supplementation.  For now, we will see her on an as-needed basis.

## 2018-10-28 NOTE — Patient Instructions (Addendum)
The irritation and swelling around your nerve will go away in about 3 months.  May use vitamin E oil or cocoa butter to the surgical site several times a day.   Follow up with your Endocrinologist for future lab work.  Follow-up with our office as needed.  Please call and ask to speak with a nurse if you develop questions or concerns.

## 2018-12-16 ENCOUNTER — Other Ambulatory Visit: Payer: Self-pay | Admitting: Internal Medicine

## 2018-12-16 DIAGNOSIS — Z1231 Encounter for screening mammogram for malignant neoplasm of breast: Secondary | ICD-10-CM

## 2019-02-01 ENCOUNTER — Ambulatory Visit
Admission: RE | Admit: 2019-02-01 | Discharge: 2019-02-01 | Disposition: A | Discharge status: Home / Self Care | Payer: Medicare Other | Source: Ambulatory Visit | Attending: Internal Medicine | Admitting: Internal Medicine

## 2019-02-01 DIAGNOSIS — Z1231 Encounter for screening mammogram for malignant neoplasm of breast: Secondary | ICD-10-CM

## 2019-12-26 ENCOUNTER — Other Ambulatory Visit: Payer: Self-pay | Admitting: Internal Medicine

## 2019-12-26 DIAGNOSIS — Z1231 Encounter for screening mammogram for malignant neoplasm of breast: Secondary | ICD-10-CM

## 2020-02-02 ENCOUNTER — Other Ambulatory Visit: Payer: Self-pay

## 2020-02-02 ENCOUNTER — Ambulatory Visit
Admission: RE | Admit: 2020-02-02 | Discharge: 2020-02-02 | Disposition: A | Discharge status: Home / Self Care | Payer: Medicare PPO | Source: Ambulatory Visit | Attending: Internal Medicine | Admitting: Internal Medicine

## 2020-02-02 DIAGNOSIS — Z1231 Encounter for screening mammogram for malignant neoplasm of breast: Secondary | ICD-10-CM | POA: Diagnosis not present

## 2020-10-03 ENCOUNTER — Other Ambulatory Visit: Payer: Self-pay | Admitting: Internal Medicine

## 2020-10-03 DIAGNOSIS — Z1231 Encounter for screening mammogram for malignant neoplasm of breast: Secondary | ICD-10-CM

## 2020-11-02 IMAGING — MG DIGITAL SCREENING BILATERAL MAMMOGRAM WITH TOMO AND CAD
8 series · 9 of 24 positions shown · non-contrast
Comparison: Previous exam(s).

CLINICAL DATA: Screening.

EXAM:
DIGITAL SCREENING BILATERAL MAMMOGRAM WITH TOMO AND CAD

[L MLO synth-2D]
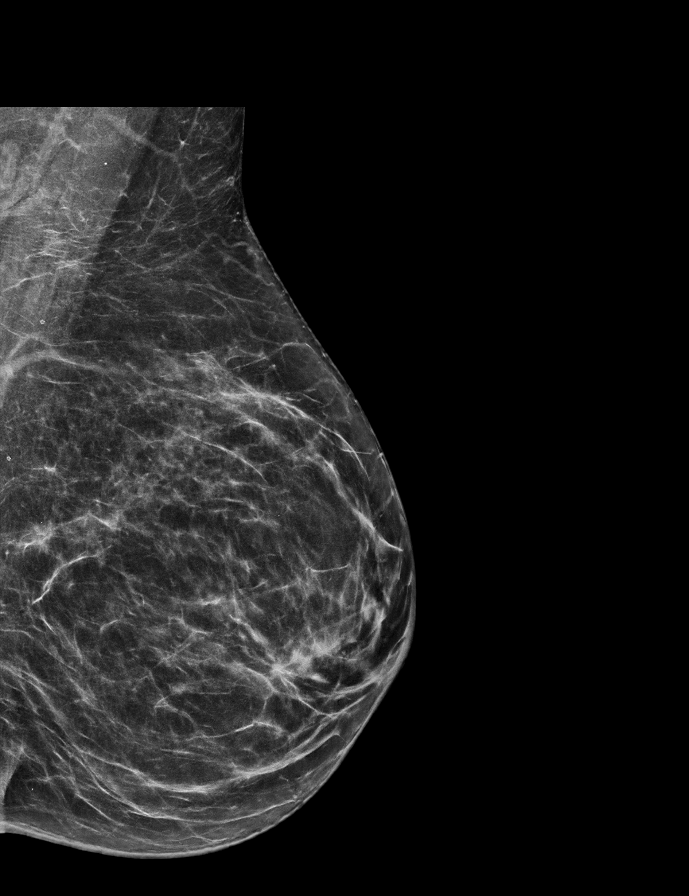

[L CC synth-2D]
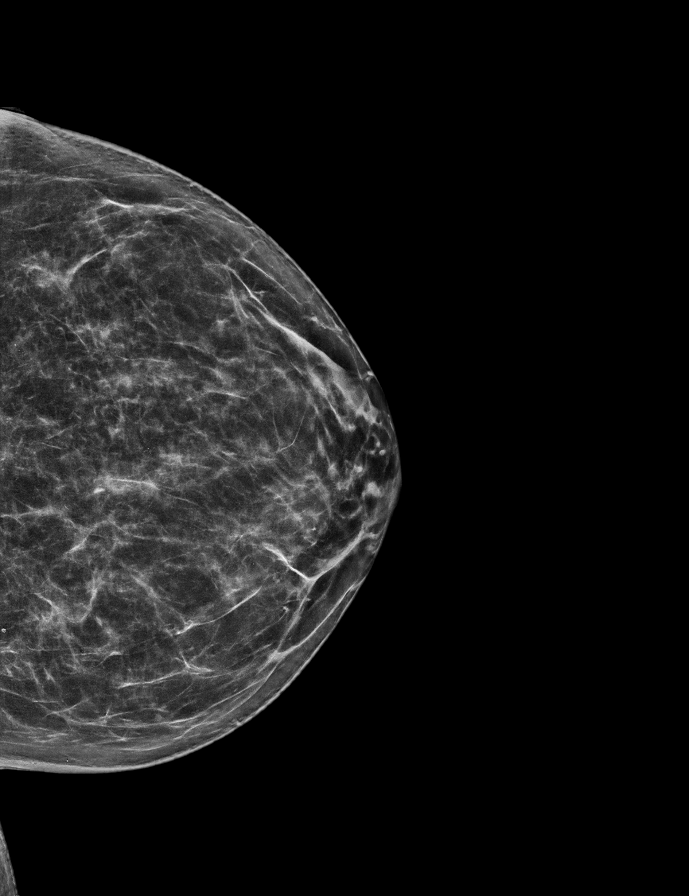

[R CC synth-2D]
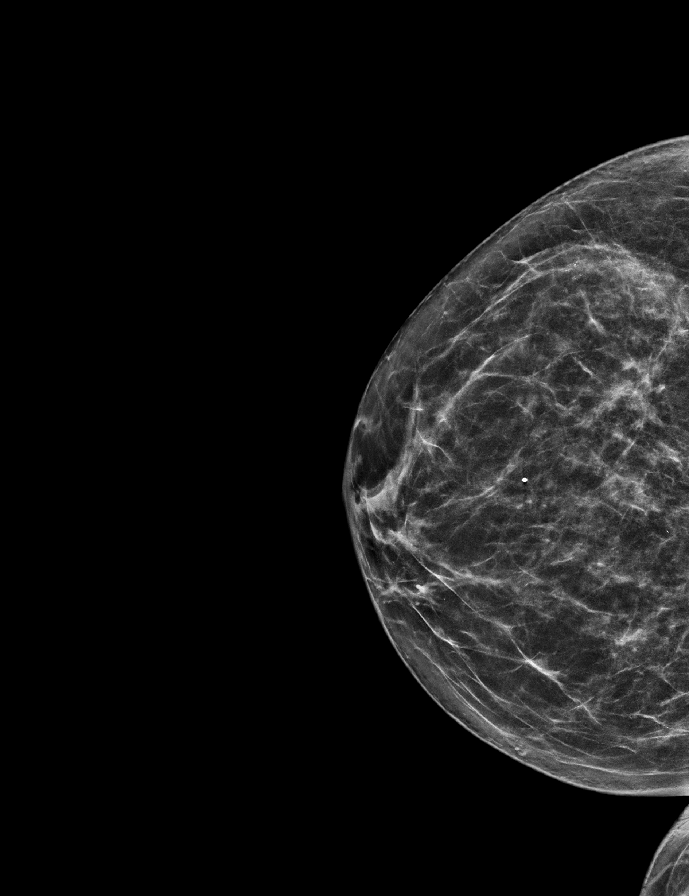

[R MLO synth-2D]
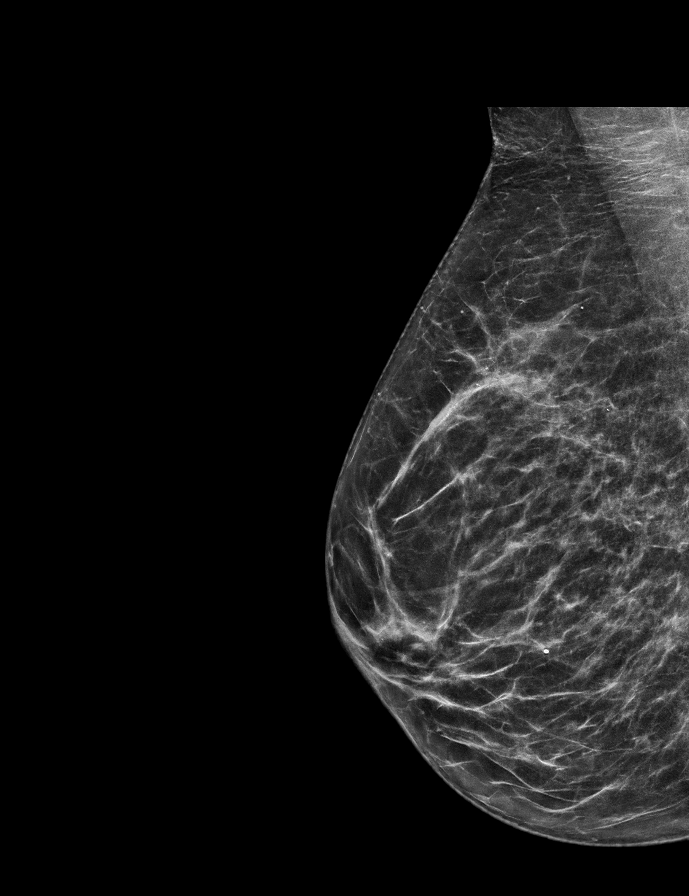

[R MLO tomo · 2 of 60 frames shown]
[frame 20/60]
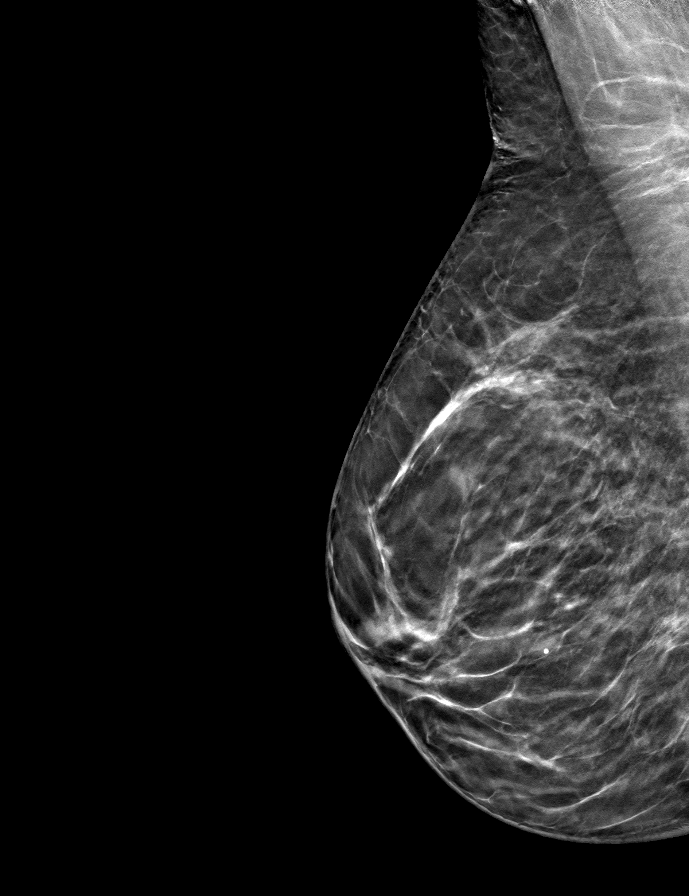
[frame 31/60]
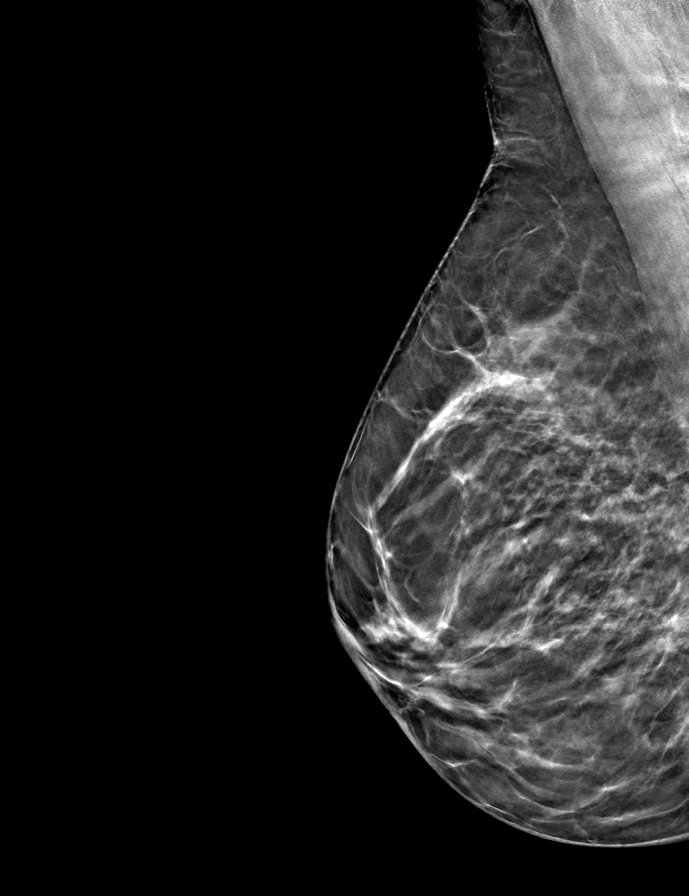

[L CC tomo · tomo slice 31/62.0]
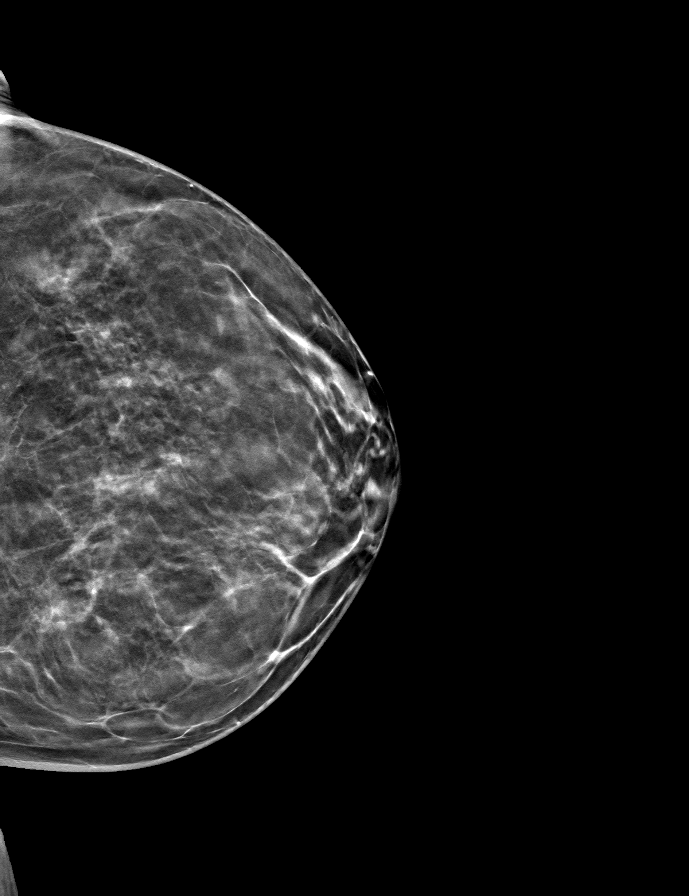

[R CC tomo · tomo slice 29/58.0]
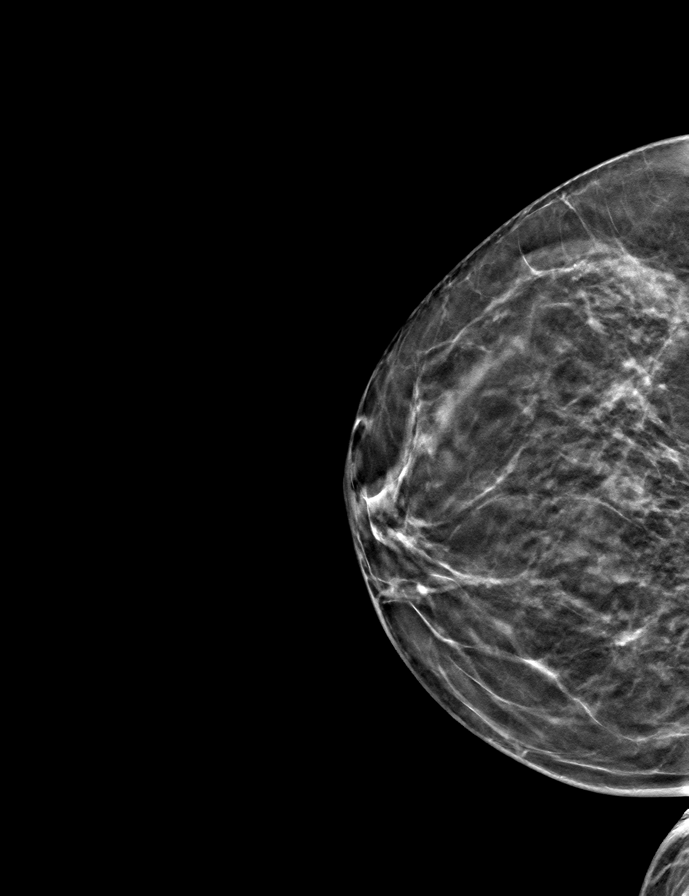

[L MLO tomo · tomo slice 32/63.0]
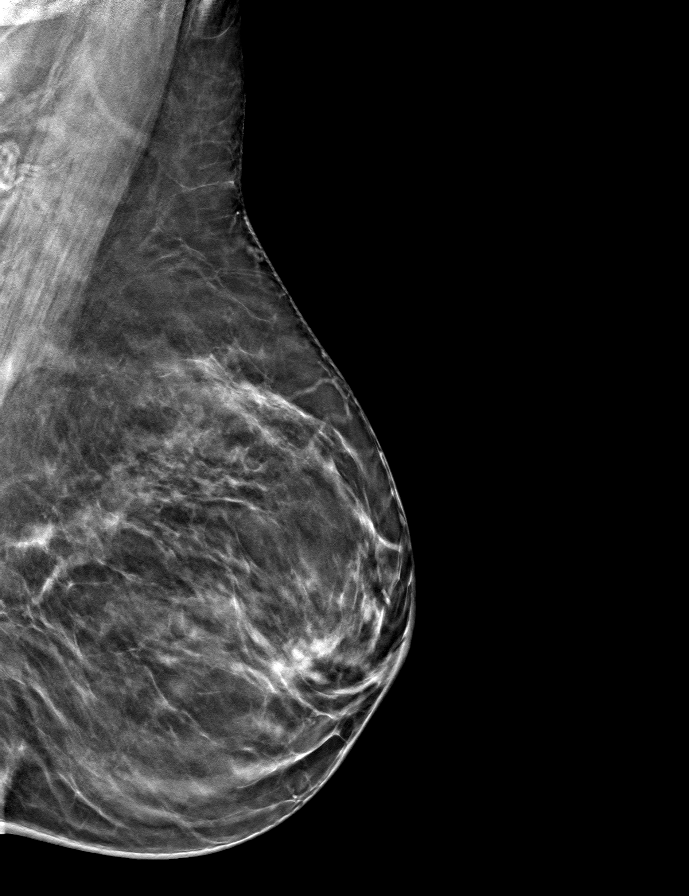

[9 of 24 positions shown; findings below may reference images not displayed]

ACR Breast Density Category b: There are scattered areas of
fibroglandular density.
FINDINGS: There are no findings suspicious for malignancy. Images were
processed with CAD.
IMPRESSION: No mammographic evidence of malignancy. A result letter of this
screening mammogram will be mailed directly to the patient.

RECOMMENDATION:
Screening mammogram in one year. (Code:CN-U-775)

BI-RADS CATEGORY  1: Negative.

## 2020-12-13 ENCOUNTER — Encounter: Payer: Self-pay | Admitting: General Surgery

## 2021-02-04 ENCOUNTER — Ambulatory Visit
Admission: RE | Admit: 2021-02-04 | Discharge: 2021-02-04 | Disposition: A | Discharge status: Home / Self Care | Payer: Medicare PPO | Source: Ambulatory Visit | Attending: Internal Medicine | Admitting: Internal Medicine

## 2021-02-04 ENCOUNTER — Other Ambulatory Visit: Payer: Self-pay

## 2021-02-04 DIAGNOSIS — Z1231 Encounter for screening mammogram for malignant neoplasm of breast: Secondary | ICD-10-CM

## 2021-11-26 ENCOUNTER — Inpatient Hospital Stay
Admission: EM | Admit: 2021-11-26 | Discharge: 2021-12-03 | DRG: 690 | Disposition: A | Discharge status: Home / Self Care | Payer: Medicare PPO | Attending: Internal Medicine | Admitting: Internal Medicine

## 2021-11-26 ENCOUNTER — Other Ambulatory Visit: Payer: Self-pay

## 2021-11-26 DIAGNOSIS — N136 Pyonephrosis: Principal | ICD-10-CM | POA: Diagnosis present

## 2021-11-26 DIAGNOSIS — Z7983 Long term (current) use of bisphosphonates: Secondary | ICD-10-CM

## 2021-11-26 DIAGNOSIS — E890 Postprocedural hypothyroidism: Secondary | ICD-10-CM

## 2021-11-26 DIAGNOSIS — R509 Fever, unspecified: Secondary | ICD-10-CM

## 2021-11-26 DIAGNOSIS — L950 Livedoid vasculitis: Secondary | ICD-10-CM

## 2021-11-26 DIAGNOSIS — N1832 Chronic kidney disease, stage 3b: Secondary | ICD-10-CM

## 2021-11-26 DIAGNOSIS — N390 Urinary tract infection, site not specified: Principal | ICD-10-CM

## 2021-11-26 DIAGNOSIS — K5900 Constipation, unspecified: Secondary | ICD-10-CM | POA: Diagnosis present

## 2021-11-26 DIAGNOSIS — M810 Age-related osteoporosis without current pathological fracture: Secondary | ICD-10-CM | POA: Diagnosis present

## 2021-11-26 DIAGNOSIS — R531 Weakness: Secondary | ICD-10-CM

## 2021-11-26 DIAGNOSIS — G629 Polyneuropathy, unspecified: Secondary | ICD-10-CM | POA: Diagnosis present

## 2021-11-26 DIAGNOSIS — N1330 Unspecified hydronephrosis: Secondary | ICD-10-CM

## 2021-11-26 DIAGNOSIS — Z881 Allergy status to other antibiotic agents status: Secondary | ICD-10-CM

## 2021-11-26 DIAGNOSIS — Z7982 Long term (current) use of aspirin: Secondary | ICD-10-CM

## 2021-11-26 DIAGNOSIS — E872 Acidosis, unspecified: Secondary | ICD-10-CM | POA: Diagnosis present

## 2021-11-26 DIAGNOSIS — Z1152 Encounter for screening for COVID-19: Secondary | ICD-10-CM

## 2021-11-26 DIAGNOSIS — N261 Atrophy of kidney (terminal): Secondary | ICD-10-CM | POA: Diagnosis present

## 2021-11-26 DIAGNOSIS — E042 Nontoxic multinodular goiter: Secondary | ICD-10-CM | POA: Diagnosis present

## 2021-11-26 DIAGNOSIS — G5790 Unspecified mononeuropathy of unspecified lower limb: Secondary | ICD-10-CM | POA: Diagnosis present

## 2021-11-26 DIAGNOSIS — Z79899 Other long term (current) drug therapy: Secondary | ICD-10-CM

## 2021-11-26 DIAGNOSIS — I776 Arteritis, unspecified: Secondary | ICD-10-CM | POA: Diagnosis present

## 2021-11-26 LAB — COMPREHENSIVE METABOLIC PANEL
ALT: 28 U/L (ref 0–44)
AST: 34 U/L (ref 15–41)
Albumin: 3.5 g/dL (ref 3.5–5.0)
Alkaline Phosphatase: 39 U/L (ref 38–126)
Anion gap: 10 (ref 5–15)
BUN: 19 mg/dL (ref 8–23)
CO2: 21 mmol/L — ABNORMAL LOW (ref 22–32)
Calcium: 8.7 mg/dL — ABNORMAL LOW (ref 8.9–10.3)
Chloride: 104 mmol/L (ref 98–111)
Creatinine, Ser: 1.36 mg/dL — ABNORMAL HIGH (ref 0.44–1.00)
GFR, Estimated: 41 mL/min — ABNORMAL LOW (ref >60)
Glucose, Bld: 128 mg/dL — ABNORMAL HIGH (ref 70–99)
Potassium: 3.6 mmol/L (ref 3.5–5.1)
Sodium: 135 mmol/L (ref 135–145)
Total Bilirubin: 1.7 mg/dL — ABNORMAL HIGH (ref 0.3–1.2)
Total Protein: 7.2 g/dL (ref 6.5–8.1)

## 2021-11-26 LAB — CBC
HCT: 39 % (ref 36.0–46.0)
Hemoglobin: 13.4 g/dL (ref 12.0–15.0)
MCH: 32.3 pg (ref 26.0–34.0)
MCHC: 34.4 g/dL (ref 30.0–36.0)
MCV: 94 fL (ref 80.0–100.0)
Platelets: 189 10*3/uL (ref 150–400)
RBC: 4.15 MIL/uL (ref 3.87–5.11)
RDW: 11.9 % (ref 11.5–15.5)
WBC: 8.5 10*3/uL (ref 4.0–10.5)
nRBC: 0 % (ref 0.0–0.2)

## 2021-11-26 LAB — LIPASE, BLOOD: Lipase: 39 U/L (ref 11–51)

## 2021-11-26 NOTE — ED Triage Notes (Addendum)
Pt comes with c/o fever since Sunday. Pt denies any belly pain. Pt states mid back discomfort. Pt did have UA test completed and was prescribed medications. Pt took one dose today.   Pt states she just doesn't feel right and feels weird. Pt states her head just feels heavy.   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   Pt took home covid test and it was neg.

## 2021-11-27 ENCOUNTER — Emergency Department: Payer: Medicare PPO

## 2021-11-27 ENCOUNTER — Encounter: Payer: Self-pay | Admitting: Internal Medicine

## 2021-11-27 ENCOUNTER — Inpatient Hospital Stay: Payer: Medicare PPO

## 2021-11-27 DIAGNOSIS — N390 Urinary tract infection, site not specified: Secondary | ICD-10-CM | POA: Diagnosis present

## 2021-11-27 DIAGNOSIS — R509 Fever, unspecified: Secondary | ICD-10-CM | POA: Diagnosis not present

## 2021-11-27 DIAGNOSIS — Z881 Allergy status to other antibiotic agents status: Secondary | ICD-10-CM | POA: Diagnosis not present

## 2021-11-27 DIAGNOSIS — G629 Polyneuropathy, unspecified: Secondary | ICD-10-CM | POA: Diagnosis present

## 2021-11-27 DIAGNOSIS — E872 Acidosis, unspecified: Secondary | ICD-10-CM | POA: Diagnosis present

## 2021-11-27 DIAGNOSIS — E042 Nontoxic multinodular goiter: Secondary | ICD-10-CM | POA: Diagnosis present

## 2021-11-27 DIAGNOSIS — K5900 Constipation, unspecified: Secondary | ICD-10-CM | POA: Diagnosis present

## 2021-11-27 DIAGNOSIS — Z7982 Long term (current) use of aspirin: Secondary | ICD-10-CM | POA: Diagnosis not present

## 2021-11-27 DIAGNOSIS — L950 Livedoid vasculitis: Secondary | ICD-10-CM | POA: Diagnosis not present

## 2021-11-27 DIAGNOSIS — Z79899 Other long term (current) drug therapy: Secondary | ICD-10-CM | POA: Diagnosis not present

## 2021-11-27 DIAGNOSIS — N1832 Chronic kidney disease, stage 3b: Secondary | ICD-10-CM | POA: Diagnosis present

## 2021-11-27 DIAGNOSIS — N131 Hydronephrosis with ureteral stricture, not elsewhere classified: Secondary | ICD-10-CM | POA: Diagnosis not present

## 2021-11-27 DIAGNOSIS — Z1152 Encounter for screening for COVID-19: Secondary | ICD-10-CM | POA: Diagnosis not present

## 2021-11-27 DIAGNOSIS — N136 Pyonephrosis: Secondary | ICD-10-CM | POA: Diagnosis present

## 2021-11-27 DIAGNOSIS — N261 Atrophy of kidney (terminal): Secondary | ICD-10-CM | POA: Diagnosis present

## 2021-11-27 DIAGNOSIS — M810 Age-related osteoporosis without current pathological fracture: Secondary | ICD-10-CM | POA: Diagnosis present

## 2021-11-27 DIAGNOSIS — R531 Weakness: Secondary | ICD-10-CM

## 2021-11-27 DIAGNOSIS — N132 Hydronephrosis with renal and ureteral calculous obstruction: Secondary | ICD-10-CM | POA: Diagnosis not present

## 2021-11-27 DIAGNOSIS — N1330 Unspecified hydronephrosis: Secondary | ICD-10-CM | POA: Diagnosis not present

## 2021-11-27 DIAGNOSIS — Z7983 Long term (current) use of bisphosphonates: Secondary | ICD-10-CM | POA: Diagnosis not present

## 2021-11-27 DIAGNOSIS — I776 Arteritis, unspecified: Secondary | ICD-10-CM | POA: Diagnosis present

## 2021-11-27 LAB — URINALYSIS, ROUTINE W REFLEX MICROSCOPIC
Bilirubin Urine: NEGATIVE
Glucose, UA: 50 mg/dL — AB
Ketones, ur: 5 mg/dL — AB
Nitrite: NEGATIVE
Protein, ur: 100 mg/dL — AB
Specific Gravity, Urine: 1.005 (ref 1.005–1.030)
WBC, UA: >50 WBC/hpf — ABNORMAL HIGH (ref 0–5)
pH: 6 (ref 5.0–8.0)

## 2021-11-27 LAB — RESP PANEL BY RT-PCR (FLU A&B, COVID) ARPGX2
Influenza A by PCR: NEGATIVE
Influenza B by PCR: NEGATIVE
SARS Coronavirus 2 by RT PCR: NEGATIVE

## 2021-11-27 LAB — LACTIC ACID, PLASMA
Lactic Acid, Venous: 2 mmol/L (ref 0.5–1.9)
Lactic Acid, Venous: 1.5 mmol/L (ref 0.5–1.9)

## 2021-11-27 MED ORDER — SODIUM CHLORIDE 0.9 % IV SOLN
1.0000 g | Freq: Once | INTRAVENOUS | Status: AC
  Administered 2021-11-27: 1 g via INTRAVENOUS
  Filled 2021-11-27: qty 10

## 2021-11-27 MED ORDER — ONDANSETRON HCL 4 MG/2ML IJ SOLN: 4.0000 mg | Freq: Four times a day (QID) | INTRAMUSCULAR | Status: DC | PRN

## 2021-11-27 MED ORDER — SODIUM CHLORIDE 0.9 % IV BOLUS
1000.0000 mL | Freq: Once | INTRAVENOUS | Status: AC
  Administered 2021-11-27: 1000 mL via INTRAVENOUS

## 2021-11-27 MED ORDER — ACETAMINOPHEN 325 MG PO TABS
650.0000 mg | ORAL_TABLET | Freq: Four times a day (QID) | ORAL | Status: DC | PRN
  Administered 2021-11-27 – 2021-11-29 (×6): 650 mg via ORAL
  Filled 2021-11-27 (×6): qty 2

## 2021-11-27 MED ORDER — SODIUM CHLORIDE 0.9 % IV SOLN: INTRAVENOUS | Status: AC

## 2021-11-27 MED ORDER — ACETAMINOPHEN 650 MG RE SUPP: 650.0000 mg | Freq: Four times a day (QID) | RECTAL | Status: DC | PRN

## 2021-11-27 MED ORDER — HYDROCODONE-ACETAMINOPHEN 5-325 MG PO TABS: 1.0000 | ORAL_TABLET | ORAL | Status: DC | PRN

## 2021-11-27 MED ORDER — ENOXAPARIN SODIUM 40 MG/0.4ML IJ SOSY: 40.0000 mg | PREFILLED_SYRINGE | INTRAMUSCULAR | Status: DC

## 2021-11-27 MED ORDER — LACTATED RINGERS IV SOLN: INTRAVENOUS | Status: DC

## 2021-11-27 MED ORDER — ENOXAPARIN SODIUM 30 MG/0.3ML IJ SOSY
30.0000 mg | PREFILLED_SYRINGE | INTRAMUSCULAR | Status: DC
  Administered 2021-11-28: 30 mg via SUBCUTANEOUS
  Filled 2021-11-27: qty 0.3

## 2021-11-27 MED ORDER — ONDANSETRON HCL 4 MG PO TABS: 4.0000 mg | ORAL_TABLET | Freq: Four times a day (QID) | ORAL | Status: DC | PRN

## 2021-11-27 MED ORDER — SODIUM CHLORIDE 0.9 % IV SOLN: 1.0000 g | INTRAVENOUS | Status: DC

## 2021-11-27 MED ORDER — SODIUM CHLORIDE 0.9 % IV SOLN
2.0000 g | INTRAVENOUS | Status: DC
  Administered 2021-11-27 – 2021-11-28 (×2): 2 g via INTRAVENOUS
  Filled 2021-11-27: qty 2
  Filled 2021-11-27: qty 20
  Filled 2021-11-27: qty 2

## 2021-11-27 NOTE — ED Notes (Signed)
Informed RN bed assigned 

## 2021-11-27 NOTE — H&P (Signed)
History and Physical    Patient: Megan Shepard BJY:[id number] DOB: 1945/10/21 DOA: 11/26/2021 DOS: the patient was seen and examined on 11/27/2021 PCP: Adin Hector, MD  Patient coming from: Home  Chief Complaint:  Chief Complaint  Patient presents with   Fever   Abdominal Pain    HPI: Megan Shepard is a 76 y.o. female with medical history significant for CKD 3B, thyroid nodules followed by endocrinology s/p partial thyroidectomy, peripheral neuropathy, livedo vasculopathy who presents to the ED with a 2-day history of fever up to 104.3, low back pain and generalized malaise with protracted weakness.  She saw her PCP and was diagnosed with a UTI and was prescribed antibiotics which she started today but due to persistent weakness she decided to come into the emergency room for further evaluation. ED course and data review: Vitals within normal limits.  Labs significant for normal WBC but with lactic acid of 2.0.  Creatinine at baseline at 1.36.  Urinalysis with large leukocyte esterase greater than 50 WBCs and rare bacteria.  EKG, personally viewed and interpreted with sinus rhythm of 64 with no acute ST-T wave changes and chest x-ray clear. Patient given a fluid bolus started on Rocephin and hospitalist consulted for admission.   Review of Systems: As mentioned in the history of present illness. All other systems reviewed and are negative.  Past Medical History:  Diagnosis Date   Dysrhythmia    Fibrocystic breast disease    Lichen simplex chronicus    Microscopic hematuria    Neuropathy    Nodule of left lobe of thyroid gland    Occult blood in stools    Osteoporosis    Varicose vein    Vasculitis (HCC)    Past Surgical History:  Procedure Laterality Date   COLONOSCOPY     COLONOSCOPY WITH PROPOFOL N/A 05/18/2015   Procedure: COLONOSCOPY WITH PROPOFOL;  Surgeon: Hulen Luster, MD;  Location: [facility] ENDOSCOPY;  Service: Gastroenterology;  Laterality: N/A;   kidney stones      PARATHYROIDECTOMY Left 10/11/2018   Procedure: PARATHYROIDECTOMY AUTOTRANSPLANT;  Surgeon: Fredirick Maudlin, MD;  Location: [facility] ORS;  Service: General;  Laterality: Left;   THYROID LOBECTOMY Left 10/11/2018   Procedure: THYROID LOBECTOMY, LEFT,;  Surgeon: Fredirick Maudlin, MD;  Location: [facility] ORS;  Service: General;  Laterality: Left;   VENOUS ABLATION     Social History:  reports that she has never smoked. She has never used smokeless tobacco. She reports that she does not drink alcohol and does not use drugs.  Allergies  Allergen Reactions   Erythromycin Rash    Family History  Problem Relation Age of Onset   Breast cancer Maternal Aunt        80's    Prior to Admission medications   Medication Sig Start Date End Date Taking? Authorizing Provider  alendronate (FOSAMAX) 70 MG tablet Take 70 mg by mouth every Sunday. Take with a full glass of water on an empty stomach.     [provider]  aspirin 81 MG tablet Take 81 mg by mouth 2 (two) times a week.     [provider]  Calcium Carb-Cholecalciferol (CALCIUM 600+D3 PO) Take 1 tablet by mouth 2 (two) times daily.    [provider]  Cholecalciferol (VITAMIN D3) 25 MCG (1000 UT) CAPS Take 1,000 Units by mouth daily.    [provider]  Magnesium 500 MG TABS Take 500 mg by mouth daily.    [provider]  magnesium  oxide (MAG-OX) 400 MG tablet Take 400 mg by mouth daily.    [provider]  Probiotic Product (PROBIOTIC PO) Take 1 capsule by mouth 2 (two) times daily.    [provider]  vitamin C (ASCORBIC ACID) 500 MG tablet Take 500 mg by mouth 2 (two) times daily.     [provider]    Physical Exam: Vitals:   11/26/21 1840 11/27/21 0018 11/27/21 0100 11/27/21 0325  BP: 118/68 136/72 122/73 112/77  Pulse: 70 63 (!) 59 69  Resp: '18 18 13 20  '$ Temp: 97.8 F (36.6 C) (!) 97.3 F (36.3 C)  98.1 F (36.7 C)  TempSrc:  Oral  Oral  SpO2: 97% 100% 97% 100%    Physical Exam Vitals and nursing note reviewed.  Constitutional:      General: She is not in acute distress. HENT:     Head: Normocephalic and atraumatic.  Cardiovascular:     Rate and Rhythm: Normal rate and regular rhythm.     Heart sounds: Normal heart sounds.  Pulmonary:     Effort: Pulmonary effort is normal.     Breath sounds: Normal breath sounds.  Abdominal:     Palpations: Abdomen is soft.     Tenderness: There is no abdominal tenderness.  Neurological:     Mental Status: Mental status is at baseline.     Labs on Admission: I have personally reviewed following labs and imaging studies  CBC: Recent Labs  Lab 11/26/21 1840  WBC 8.5  HGB 13.4  HCT 39.0  MCV 94.0  PLT 676   Basic Metabolic Panel: Recent Labs  Lab 11/26/21 1840  NA 135  K 3.6  CL 104  CO2 21*  GLUCOSE 128*  BUN 19  CREATININE 1.36*  CALCIUM 8.7*   GFR: CrCl cannot be calculated (Unknown ideal weight.). Liver Function Tests: Recent Labs  Lab 11/26/21 1840  AST 34  ALT 28  ALKPHOS 39  BILITOT 1.7*  PROT 7.2  ALBUMIN 3.5   Recent Labs  Lab 11/26/21 1840  LIPASE 39   No results for input(s): "AMMONIA" in the last 168 hours. Coagulation Profile: No results for input(s): "INR", "PROTIME" in the last 168 hours. Cardiac Enzymes: No results for input(s): "CKTOTAL", "CKMB", "CKMBINDEX", "TROPONINI" in the last 168 hours. BNP (last 3 results) No results for input(s): "PROBNP" in the last 8760 hours. HbA1C: No results for input(s): "HGBA1C" in the last 72 hours. CBG: No results for input(s): "GLUCAP" in the last 168 hours. Lipid Profile: No results for input(s): "CHOL", "HDL", "LDLCALC", "TRIG", "CHOLHDL", "LDLDIRECT" in the last 72 hours. Thyroid Function Tests: No results for input(s): "TSH", "T4TOTAL", "FREET4", "T3FREE", "THYROIDAB" in the last 72 hours. Anemia Panel: No results for input(s): "VITAMINB12", "FOLATE", "FERRITIN", "TIBC", "IRON", "RETICCTPCT" in the last 72  hours. Urine analysis:    Component Value Date/Time   COLORURINE YELLOW (A) 11/27/2021 0006   APPEARANCEUR TURBID (A) 11/27/2021 0006   LABSPEC 1.005 11/27/2021 0006   PHURINE 6.0 11/27/2021 0006   GLUCOSEU 50 (A) 11/27/2021 0006   HGBUR MODERATE (A) 11/27/2021 0006   BILIRUBINUR NEGATIVE 11/27/2021 0006   KETONESUR 5 (A) 11/27/2021 0006   PROTEINUR 100 (A) 11/27/2021 0006   NITRITE NEGATIVE 11/27/2021 0006   LEUKOCYTESUR LARGE (A) 11/27/2021 0006    Radiological Exams on Admission: DG Chest 2 View  Result Date: 11/27/2021 CLINICAL DATA:  Fever and cough. EXAM: CHEST - 2 VIEW COMPARISON:  None Available. FINDINGS: The heart size and mediastinal  contours are within normal limits. There is no evidence of acute infiltrate, pleural effusion or pneumothorax. The visualized skeletal structures are unremarkable. IMPRESSION: No active cardiopulmonary disease. Electronically Signed   By: Virgina Norfolk M.D.   On: 11/27/2021 00:54     Data Reviewed: Relevant notes from primary care and specialist visits, past discharge summaries as available in EHR, including Care Everywhere. 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  Has seen rheumatology in the past  Thyroid nodules S/P partial thyroidectomy Followed by endocrinology  Neuropathy of lower extremity Continue gabapentin  Stage 3b chronic kidney disease ([place]) Renal function at baseline       DVT  prophylaxis: Lovenox  Consults: none  Advance Care Planning:   Code Status: Full Code   Family Communication: Daughter at bedside  Disposition Plan: Back to previous home environment  Severity of Illness: The appropriate patient status for this patient is INPATIENT. Inpatient status is judged to be reasonable and necessary in order to provide the required intensity of service to ensure the patient's safety. The patient's presenting symptoms, physical exam findings, and initial radiographic and laboratory data in the context of their chronic comorbidities is felt to place them at high risk for further clinical deterioration. Furthermore, it is not anticipated that the patient will be medically stable for discharge from the hospital within 2 midnights of admission.   * I certify that at the point of admission it is my clinical judgment that the patient will require inpatient hospital care spanning beyond 2 midnights from the point of admission due to high intensity of service, high risk for further deterioration and high frequency of surveillance required.*  Author: Athena Masse, MD 11/27/2021 3:36 AM  For on call review [email or web address].

## 2021-11-27 NOTE — Assessment & Plan Note (Signed)
Followed by endocrinology 

## 2021-11-27 NOTE — Assessment & Plan Note (Signed)
Followed by hematology.  Has seen rheumatology in the past

## 2021-11-27 NOTE — Progress Notes (Signed)
Patient with previous yellow MEWS. See flowsheet; seen MAR.    11/27/21 2132  Assess: MEWS Score  Temp (!) 103.1 F (39.5 C)  Assess: MEWS Score  MEWS Temp 2  MEWS Systolic 0  MEWS Pulse 0  MEWS RR 0  MEWS LOC 0  MEWS Score 2  MEWS Score Color Yellow  Assess: if the MEWS score is Yellow or Red  Were vital signs taken at a resting state? Yes  Focused Assessment No change from prior assessment  Does the patient meet 2 or more of the SIRS criteria? Yes  Does the patient have a confirmed or suspected source of infection? Yes  Provider and Rapid Response Notified? No  MEWS guidelines implemented *See Row Information* Yes  Treat  MEWS Interventions Administered prn meds/treatments;Administered scheduled meds/treatments  Take Vital Signs  Increase Vital Sign Frequency  Yellow: Q 2hr X 2 then Q 4hr X 2, if remains yellow, continue Q 4hrs  Escalate  MEWS: Escalate Yellow: discuss with charge nurse/RN and consider discussing with provider and RRT  Notify: Charge Nurse/RN  Name of Charge Nurse/RN Notified Jodie RN  Date Charge Nurse/RN Notified 11/27/21  Time Charge Nurse/RN Notified 2133  Document  Progress note created (see row info) Yes  Assess: SIRS CRITERIA  SIRS Temperature  1  SIRS Pulse 0  SIRS Respirations  0  SIRS WBC 1  SIRS Score Sum  2

## 2021-11-27 NOTE — Hospital Course (Addendum)
[name] is a 76 y.o. female with medical history significant for CKD 3B, thyroid nodules followed by endocrinology s/p partial thyroidectomy, peripheral neuropathy, livedo vasculopathy who presents to the ED with a 2-day history of fever up to 104.3, low back pain and generalized malaise with protracted weakness. Upon arriving the hospital, she had a fever of 103.  She also has abnormal urine, she was started on Rocephin for UTI, urine culture sent out. 10/6 Patient renal ultrasound showed left-sided hydronephrosis, which appeared to be chronic.  Blood culture negative, urine culture negative.   10/7.  Still had a fever, discussed with infectious disease, recommended discontinue all antibiotics.  Obtain echocardiogram, repeat blood culture and obtain NM abscess scan. 10/10.  Echocardiogram did not show any evidence of intraventricular or valvular vegetation. Nuclear scan showed delayed accumulation of WBC in left kidney.  Discussed with Dr. Steva Ready and Diamantina Providence, this is most likely due to delayed clearance with nonfunctional kidney.  No real infection.  At this point, she will be discharged home with follow-ups with her urologist Dr. Yves Dill.

## 2021-11-27 NOTE — ED Notes (Signed)
Report to Matthew, RN

## 2021-11-27 NOTE — Assessment & Plan Note (Signed)
Continue gabapentin.

## 2021-11-27 NOTE — Assessment & Plan Note (Addendum)
Rocephin and follow cultures Patient had high fever and lactic acid 2.0 but not otherwise meeting sepsis criteria Continue IV hydration

## 2021-11-27 NOTE — ED Notes (Signed)
Pt resting calm and comfortably at this time. Daughter at bedside. Pt given breakfast tray and assisted with opening condiments on tray as well as applesauce, pt denies any further needs at this time, call bell in reach.

## 2021-11-27 NOTE — Progress Notes (Signed)
  Progress Note   Patient: Megan Shepard [id number]:[id number] DOB: 03-10-1945 DOA: 11/26/2021     0 DOS: the patient was seen and examined on 11/27/2021   Brief hospital course: Megan Shepard is a 76 y.o. female with medical history significant for CKD 3B, thyroid nodules followed by endocrinology s/p partial thyroidectomy, peripheral neuropathy, livedo vasculopathy who presents to the ED with a 2-day history of fever up to 104.3, low back pain and generalized malaise with protracted weakness. Upon arriving the hospital, she had a fever of 103.  She also has abnormal urine, she was started on Rocephin for UTI, urine culture sent out.  Assessment and Plan: * Urinary tract infection High fever. Generalized weakness. Patient also has some back pain, she has a singular functioning kidney with a large kidney stone in the nonfunctional kidney. We will obtain 2 sets of blood culture, start fluids for high fever.  Patient has no history of congestive heart failure. Increased ceftriaxone to 2 g daily. Patient also has some mild upper respite symptoms, COVID negative.  I will obtain respiratory pathogen panel.  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  Has seen rheumatology in the past  Thyroid nodules S/P partial thyroidectomy Followed by endocrinology  Neuropathy of lower extremity Continue gabapentin  Stage 3b chronic kidney disease ([place]) Renal function stable.       Subjective:  Patient had a high fever today again, no shaking chills. She does not complain of dysuria, back pain has been better. No nausea vomiting.  Physical Exam: Vitals:   11/27/21 0800 11/27/21 1100 11/27/21 1400 11/27/21 1420  BP: 129/73 126/80 122/82   Pulse: 83 88 84   Resp: '18 20 18   '$ Temp:  98.5 F (36.9 C) 98.6 F (37 C) (!) 103.2 F (39.6 C)  TempSrc:  Oral Oral Oral  SpO2: 95% 96% 97%   Weight:      Height:       General exam: Appears calm and comfortable  Respiratory system: Clear to  auscultation. Respiratory effort normal. Cardiovascular system: S1 & S2 heard, RRR. No JVD, murmurs, rubs, gallops or clicks. No pedal edema. Gastrointestinal system: Abdomen is nondistended, soft and nontender. No organomegaly or masses felt. Normal bowel sounds heard.  No CVA tenderness. Central nervous system: Alert and oriented. No focal neurological deficits. Extremities: Symmetric 5 x 5 power. Skin: No rashes, lesions or ulcers Psychiatry: Judgement and insight appear normal. Mood & affect appropriate.   Data Reviewed:  Reviewed chest x-ray results and lab results.  Family Communication: Daughter updated at bedside.  Disposition: Status is: Inpatient Remains inpatient appropriate because: Severity of disease and IV treatment.  Planned Discharge Destination: Home with Home Health    Time spent: no charge  Author: Sharen Hones, MD 11/27/2021 2:44 PM  For on call review [email or web address].

## 2021-11-27 NOTE — ED Provider Notes (Signed)
[facility] Provider Note    Event Date/Time   First MD Initiated Contact with Patient 11/27/21 0002     (approximate)   History   Fever and Abdominal Pain   HPI  Megan Shepard is a 76 y.o. female with a history of chronic kidney disease, neuropathy, osteoporosis, and leukopenia who presents with fevers over the last couple days measured to as high as 102-103 at home, associated with some lightheadedness especially today and generalized weakness.  The patient reports aching pain to both sides of her lower back.  She reports nausea but no vomiting or diarrhea.  She denies any dysuria or flank pain.  The patient states that she had a urinalysis done by her doctor yesterday and was started on antibiotic, but only took 1 dose this afternoon before realizing she felt worse and deciding to come to the hospital.  I reviewed the past medical records.  The patient follows with Dr. Caryl Comes from internal medicine and was last seen on 6/23.  Urinalysis performed yesterday shows greater than 100 RBCs and WBCs as well as leukocyte esterase.   Physical Exam   Triage Vital Signs: ED Triage Vitals  Enc Vitals Group     BP 11/26/21 1840 118/68     Pulse Rate 11/26/21 1840 70     Resp 11/26/21 1840 18     Temp 11/26/21 1840 97.8 F (36.6 C)     Temp Source 11/27/21 0018 Oral     SpO2 11/26/21 1840 97 %     Weight --      Height --      Head Circumference --      Peak Flow --      Pain Score 11/26/21 1838 7     Pain Loc --      Pain Edu? --      Excl. in [place]? --     Most recent vital signs: Vitals:   11/27/21 0100 11/27/21 0325  BP: 122/73 112/77  Pulse: (!) 59 69  Resp: 13 20  Temp:  98.1 F (36.7 C)  SpO2: 97% 100%     General: Alert and oriented, weak appearing. CV:  Good peripheral perfusion.  Resp:  Normal effort.  Lungs CTAB. Abd:  Soft with no tenderness.  No distention.  Other:  No flank tenderness.  Slightly dry mucous membranes.   ED  Results / Procedures / Treatments   Labs (all labs ordered are listed, but only abnormal results are displayed) Labs Reviewed  COMPREHENSIVE METABOLIC PANEL - Abnormal; Notable for the following components:      Result Value   CO2 21 (*)    Glucose, Bld 128 (*)    Creatinine, Ser 1.36 (*)    Calcium 8.7 (*)    Total Bilirubin 1.7 (*)    GFR, Estimated 41 (*)    All other components within normal limits  URINALYSIS, ROUTINE W REFLEX MICROSCOPIC - Abnormal; Notable for the following components:   Color, Urine YELLOW (*)    APPearance TURBID (*)    Glucose, UA 50 (*)    Hgb urine dipstick MODERATE (*)    Ketones, ur 5 (*)    Protein, ur 100 (*)    Leukocytes,Ua LARGE (*)    WBC, UA >50 (*)    Bacteria, UA RARE (*)    All other components within normal limits  LACTIC ACID, PLASMA - Abnormal; Notable for the following components:   Lactic Acid, Venous 2.0 (*)  All other components within normal limits  RESP PANEL BY RT-PCR (FLU A&B, COVID) ARPGX2  LIPASE, BLOOD  CBC  LACTIC ACID, PLASMA     EKG  ED ECG REPORT I, Arta Silence, the attending physician, personally viewed and interpreted this ECG.  Date: 11/27/2021 EKG Time: 0024 Rate: 64 Rhythm: normal sinus rhythm QRS Axis: normal Intervals: normal ST/T Wave abnormalities: Nonspecific T wave abnormalities Narrative Interpretation: no evidence of acute ischemia; no significant change when compared to EKG of 10/07/2018    RADIOLOGY  Chest x-ray: I independently viewed and interpreted the images; there is no focal consolidation or edema  PROCEDURES:  Critical Care performed: No  Procedures   MEDICATIONS ORDERED IN ED: Medications  cefTRIAXone (ROCEPHIN) 1 g in sodium chloride 0.9 % 100 mL IVPB (1 g Intravenous New Bag/Given 11/27/21 0334)  acetaminophen (TYLENOL) tablet 650 mg (has no administration in time range)    Or  acetaminophen (TYLENOL) suppository 650 mg (has no administration in time range)   ondansetron (ZOFRAN) tablet 4 mg (has no administration in time range)    Or  ondansetron (ZOFRAN) injection 4 mg (has no administration in time range)  HYDROcodone-acetaminophen (NORCO/VICODIN) 5-325 MG per tablet 1-2 tablet (has no administration in time range)  0.9 %  sodium chloride infusion (has no administration in time range)  enoxaparin (LOVENOX) injection 40 mg (has no administration in time range)  sodium chloride 0.9 % bolus 1,000 mL (0 mLs Intravenous Stopped 11/27/21 0325)     IMPRESSION / MDM / ASSESSMENT AND PLAN / ED COURSE  I reviewed the triage vital signs and the nursing notes.  76 year old female with PMH as noted above presents with fever, generalized weakness, nausea, and lightheadedness.  Urinalysis yesterday was concerning for UTI.  The vital signs are currently normal and the physical exam is overall unremarkable.  Differential diagnosis includes, but is not limited to, urinary tract infection/pyelonephritis, COVID-19, other viral syndrome, less likely pneumonia or other bacterial infection.  Initial lab work-up reveals minimally elevated creatinine and no leukocytosis.  I have added on lactate, urinalysis, and respiratory panel.  We will give fluids and reassess.  I anticipate the patient may require admission.  Patient's presentation is most consistent with acute presentation with potential threat to life or bodily function.  The patient is on the cardiac monitor to evaluate for evidence of arrhythmia and/or significant heart rate changes.   ----------------------------------------- 3:38 AM on 11/27/2021 -----------------------------------------  Urinalysis confirms findings consistent with UTI.  Lactate is slightly elevated.  The patient is feeling somewhat better after fluids.  However, given her weakness and the relatively rapid progression of her symptoms I will proceed with admission.  I consulted Dr. Damita Dunnings from the hospitalist service; based on our  discussion she agrees to admit the patient.   FINAL CLINICAL IMPRESSION(S) / ED DIAGNOSES   Final diagnoses:  Urinary tract infection without hematuria, site unspecified     Rx / DC Orders   ED Discharge Orders     None        Note:  This document was prepared using Dragon voice recognition software and may include unintentional dictation errors.    Arta Silence, MD 11/27/21 [phone]

## 2021-11-27 NOTE — Assessment & Plan Note (Signed)
Renal function at baseline 

## 2021-11-27 NOTE — Progress Notes (Signed)
   11/27/21 1543  Assess: MEWS Score  Temp (!) 102.9 F (39.4 C)  BP 114/62  MAP (mmHg) 76  Pulse Rate 86  Resp 20  SpO2 96 %  Assess: MEWS Score  MEWS Temp 2  MEWS Systolic 0  MEWS Pulse 0  MEWS RR 0  MEWS LOC 0  MEWS Score 2  MEWS Score Color Yellow  Assess: if the MEWS score is Yellow or Red  Were vital signs taken at a resting state? Yes  Focused Assessment No change from prior assessment  Does the patient meet 2 or more of the SIRS criteria? No  MEWS guidelines implemented *See Row Information* Yes  Treat  MEWS Interventions Administered scheduled meds/treatments (PRN tylenol for fever)  Pain Scale 0-10  Pain Score 0  Take Vital Signs  Increase Vital Sign Frequency  Yellow: Q 2hr X 2 then Q 4hr X 2, if remains yellow, continue Q 4hrs  Escalate  MEWS: Escalate Yellow: discuss with charge nurse/RN and consider discussing with provider and RRT  Notify: Charge Nurse/RN  Name of Charge Nurse/RN Notified Hessie Dibble, RN  Date Charge Nurse/RN Notified 11/27/21  Time Charge Nurse/RN Notified 1543  Notify: Provider  Provider Name/Title Dr. Roosevelt Locks  Date Provider Notified 11/27/21  Time Provider Notified 1604  Method of Notification Page (Secure chat)  Notification Reason Critical result (temp of 102.9)  Provider response No new orders  Date of Provider Response 11/27/21  Time of Provider Response 1605  Assess: SIRS CRITERIA  SIRS Temperature  1  SIRS Pulse 0  SIRS Respirations  0  SIRS WBC 1  SIRS Score Sum  2

## 2021-11-27 NOTE — Assessment & Plan Note (Signed)
Expecting improvement with treatment of UTI

## 2021-11-28 DIAGNOSIS — L950 Livedoid vasculitis: Secondary | ICD-10-CM

## 2021-11-28 DIAGNOSIS — N1832 Chronic kidney disease, stage 3b: Secondary | ICD-10-CM | POA: Diagnosis not present

## 2021-11-28 DIAGNOSIS — R509 Fever, unspecified: Secondary | ICD-10-CM

## 2021-11-28 DIAGNOSIS — N390 Urinary tract infection, site not specified: Secondary | ICD-10-CM | POA: Diagnosis not present

## 2021-11-28 DIAGNOSIS — N132 Hydronephrosis with renal and ureteral calculous obstruction: Secondary | ICD-10-CM

## 2021-11-28 DIAGNOSIS — N1330 Unspecified hydronephrosis: Secondary | ICD-10-CM | POA: Diagnosis not present

## 2021-11-28 LAB — URINE CULTURE: Culture: NO GROWTH

## 2021-11-28 LAB — CBC
HCT: 35.2 % — ABNORMAL LOW (ref 36.0–46.0)
Hemoglobin: 11.7 g/dL — ABNORMAL LOW (ref 12.0–15.0)
MCH: 32 pg (ref 26.0–34.0)
MCHC: 33.2 g/dL (ref 30.0–36.0)
MCV: 96.2 fL (ref 80.0–100.0)
Platelets: 173 10*3/uL (ref 150–400)
RBC: 3.66 MIL/uL — ABNORMAL LOW (ref 3.87–5.11)
RDW: 12 % (ref 11.5–15.5)
WBC: 5.4 10*3/uL (ref 4.0–10.5)
nRBC: 0 % (ref 0.0–0.2)

## 2021-11-28 LAB — BASIC METABOLIC PANEL
Anion gap: 6 (ref 5–15)
BUN: 14 mg/dL (ref 8–23)
CO2: 22 mmol/L (ref 22–32)
Calcium: 7.7 mg/dL — ABNORMAL LOW (ref 8.9–10.3)
Chloride: 111 mmol/L (ref 98–111)
Creatinine, Ser: 1.13 mg/dL — ABNORMAL HIGH (ref 0.44–1.00)
GFR, Estimated: 51 mL/min — ABNORMAL LOW (ref >60)
Glucose, Bld: 105 mg/dL — ABNORMAL HIGH (ref 70–99)
Potassium: 4.5 mmol/L (ref 3.5–5.1)
Sodium: 139 mmol/L (ref 135–145)

## 2021-11-28 LAB — MAGNESIUM: Magnesium: 1.9 mg/dL (ref 1.7–2.4)

## 2021-11-28 LAB — PROCALCITONIN: Procalcitonin: 0.48 ng/mL

## 2021-11-28 MED ORDER — ENOXAPARIN SODIUM 40 MG/0.4ML IJ SOSY
40.0000 mg | PREFILLED_SYRINGE | INTRAMUSCULAR | Status: DC
  Administered 2021-11-29: 40 mg via SUBCUTANEOUS
  Filled 2021-11-28: qty 0.4

## 2021-11-28 NOTE — Progress Notes (Signed)
  Progress Note   Patient: Megan Shepard MVH:[id number] DOB: [birth year] DOA: 11/26/2021     1 DOS: the patient was seen and examined on 11/28/2021   Brief hospital course: Megan Shepard is a 76 y.o. female with medical history significant for CKD 3B, thyroid nodules followed by endocrinology s/p partial thyroidectomy, peripheral neuropathy, livedo vasculopathy who presents to the ED with a 2-day history of fever up to 104.3, low back pain and generalized malaise with protracted weakness. Upon arriving the hospital, she had a fever of 103.  She also has abnormal urine, she was started on Rocephin for UTI, urine culture sent out.  Assessment and Plan: * Urinary tract infection Left-sided hydronephrosis secondary to kidney stone. High fever. Generalized weakness. Patient also has some back pain, she has a singular functioning kidney with a large kidney stone in the nonfunctional kidney. Renal ultrasound showed significant hydronephrosis on the left side. Blood culture so far no growth, urine culture no growth. Patient still had a fever earlier this morning at 4 AM, continue Rocephin. Discussed with ID and urology, they will see patient today.  Urology is not considering any intervention for left hydronephrosis as it appears to be chronic. However, we will continue to monitor patient, if fever persists, we may have to reconsider the decision for intervention.   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  Has seen rheumatology in the past   Thyroid nodules S/P partial thyroidectomy Followed by endocrinology   Neuropathy of lower extremity Continue gabapentin   Stage 3b chronic kidney disease ([place]) Renal function stable.      Subjective:  Patient had a high fever again early this morning, currently she feels tired.  Physical Exam: Vitals:   11/28/21 0445 11/28/21 0804 11/28/21 1100 11/28/21 1150  BP: 113/67 111/69    Pulse: 82 69    Resp: 20     Temp: (!) 102.5 F (39.2  C) (S) 98.7 F (37.1 C) 97.6 F (36.4 C) 97.8 F (36.6 C)  TempSrc: Oral  Oral Oral  SpO2: 98% 97%    Weight:      Height:       General exam: Appears calm and comfortable  Respiratory system: Clear to auscultation. Respiratory effort normal. Cardiovascular system: S1 & S2 heard, RRR. No JVD, murmurs, rubs, gallops or clicks. No pedal edema. Gastrointestinal system: Abdomen is nondistended, soft and nontender. No organomegaly or masses felt. Normal bowel sounds heard. Central nervous system: Alert and oriented x3. No focal neurological deficits. Extremities: Symmetric 5 x 5 power. Skin: No rashes, lesions or ulcers Psychiatry: Mood & affect appropriate.   Data Reviewed:  Lab results and ultrasound results reviewed.  Family Communication: Daughter updated at the bedside.  Disposition: Status is: Inpatient Remains inpatient appropriate because: Severity of disease, IV treatment.  Planned Discharge Destination: Home with Home Health    Time spent: 35 minutes  Author: Sharen Hones, MD 11/28/2021 1:03 PM  For on call review [email or web address].

## 2021-11-28 NOTE — Plan of Care (Signed)

## 2021-11-28 NOTE — Consult Note (Addendum)
NAME: Megan Shepard  DOB: 1945-12-18  MRN: [id number]  Date/Time: 11/28/2021 12:41 PM  REQUESTING PROVIDER: Dr.Zhang Subjective:  REASON FOR CONSULT: fever R/o complicated UTI ? Megan Shepard is a 76 y.o. 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 She had temperature of 101-103 since sunday and some discomfort in her lower back-She also had some nausea- she thought  She went to her PCP on 11/26/21 thinking she may have UTI and had U/A which was abnormal  and UC and given a prescription for keflex, which she took 2 doses. She came to the ED on 10/4 as she was continuing to have fever She has no cough, sob, no diarrhea, no sore throat. Has some headache.  No dysuria, no hematuria No travel No sick contacts Vitals in the ED  11/26/21 18:40  BP 118/68  Temp 97.8 F (36.6 C)  Pulse Rate 70  Resp 18  SpO2 97 %   Labs in the ED  Latest Reference Range & Units 11/26/21 18:40  WBC 4.0 - 10.5 K/uL 8.5  Hemoglobin 12.0 - 15.0 g/dL 13.4  HCT 36.0 - 46.0 % 39.0  Platelets 150 - 400 K/uL 189  Creatinine 0.44 - 1.00 mg/dL 1.36 (H)    Past Medical History:  Diagnosis Date   Dysrhythmia    Fibrocystic breast disease    Lichen simplex chronicus    Microscopic hematuria    Neuropathy    Nodule of left lobe of thyroid gland    Occult blood in stools    Osteoporosis    Varicose vein    Vasculitis (HCC)     Past Surgical History:  Procedure Laterality Date   COLONOSCOPY     COLONOSCOPY WITH PROPOFOL N/A 05/18/2015   Procedure: COLONOSCOPY WITH PROPOFOL;  Surgeon: Hulen Luster, MD;  Location: [facility] ENDOSCOPY;  Service: Gastroenterology;  Laterality: N/A;   kidney stones     PARATHYROIDECTOMY Left 10/11/2018   Procedure: PARATHYROIDECTOMY AUTOTRANSPLANT;  Surgeon: Fredirick Maudlin, MD;  Location: [facility] ORS;  Service:  General;  Laterality: Left;   THYROID LOBECTOMY Left 10/11/2018   Procedure: THYROID LOBECTOMY, LEFT,;  Surgeon: Fredirick Maudlin, MD;  Location: [facility] ORS;  Service: General;  Laterality: Left;   VENOUS ABLATION      Social History   Socioeconomic History   Marital status: Married    Spouse name: Not on file   Number of children: Not on file   Years of education: Not on file   Highest education level: Not on file  Occupational History   Not on file  Tobacco Use   Smoking status: Never   Smokeless tobacco: Never  Vaping Use   Vaping Use: Never used  Substance and Sexual Activity   Alcohol use: No   Drug use: No   Sexual activity: Not on file  Other Topics Concern   Not on file  Social History Narrative   Not on file   Social Determinants of Health   Financial Resource Strain: Not on file  Food Insecurity: No Food Insecurity (11/27/2021)   Hunger Vital Sign    Worried About Running Out of Food in the Last Year: Never true    Ran Out of Food in the Last Year: Never true  Transportation Needs: No Transportation Needs (11/27/2021)   PRAPARE - Hydrologist (Medical):  No    Lack of Transportation (Non-Medical): No  Physical Activity: Not on file  Stress: Not on file  Social Connections: Not on file  Intimate Partner Violence: Not At Risk (11/27/2021)   Humiliation, Afraid, Rape, and Kick questionnaire    Fear of Current or Ex-Partner: No    Emotionally Abused: No    Physically Abused: No    Sexually Abused: No    Family History  Problem Relation Age of Onset   Breast cancer Maternal Aunt        80's   Allergies  Allergen Reactions   Erythromycin Rash   I? Current Facility-Administered Medications  Medication Dose Route Frequency Provider Last Rate Last Admin   acetaminophen (TYLENOL) tablet 650 mg  650 mg Oral Q6H PRN Athena Masse, MD   650 mg at 11/28/21 0450   Or   acetaminophen (TYLENOL) suppository 650 mg  650 mg Rectal Q6H PRN  Athena Masse, MD       cefTRIAXone (ROCEPHIN) 2 g in sodium chloride 0.9 % 100 mL IVPB  2 g Intravenous Q24H Sharen Hones, MD   Stopped at 11/27/21 2157   enoxaparin (LOVENOX) injection 30 mg  30 mg Subcutaneous Q24H Renda Rolls, RPH   30 mg at 11/28/21 5621   HYDROcodone-acetaminophen (NORCO/VICODIN) 5-325 MG per tablet 1-2 tablet  1-2 tablet Oral Q4H PRN Athena Masse, MD       lactated ringers infusion   Intravenous Continuous Sharen Hones, MD 75 mL/hr at 11/28/21 0806 Infusion Verify at 11/28/21 0806   ondansetron (ZOFRAN) tablet 4 mg  4 mg Oral Q6H PRN Athena Masse, MD       Or   ondansetron [facility]) injection 4 mg  4 mg Intravenous Q6H PRN Athena Masse, MD         Abtx:  Anti-infectives (From admission, onward)    Start     Dose/Rate Route Frequency Ordered Stop   11/27/21 2200  cefTRIAXone (ROCEPHIN) 1 g in sodium chloride 0.9 % 100 mL IVPB  Status:  Discontinued        1 g 200 mL/hr over 30 Minutes Intravenous Every 24 hours 11/27/21 1221 11/27/21 1428   11/27/21 2200  cefTRIAXone (ROCEPHIN) 2 g in sodium chloride 0.9 % 100 mL IVPB        2 g 200 mL/hr over 30 Minutes Intravenous Every 24 hours 11/27/21 1428     11/27/21 0245  cefTRIAXone (ROCEPHIN) 1 g in sodium chloride 0.9 % 100 mL IVPB        1 g 200 mL/hr over 30 Minutes Intravenous  Once 11/27/21 0242 11/27/21 0430       REVIEW OF SYSTEMS:  Const:  fever, chills, negative weight loss Eyes: negative diplopia or visual changes, negative eye pain ENT: negative coryza, negative sore throat Resp: negative cough, hemoptysis, dyspnea Cards: negative for chest pain, palpitations, lower extremity edema GU: negative for frequency, dysuria and hematuria GI: Negative for abdominal pain, diarrhea, bleeding, constipation Discomfort lower back Skin: negative for rash and pruritus Heme: negative for easy bruising and gum/nose bleeding MS: discomfort lower back Neurolo:+ headaches, dizziness, no vertigo, memory  problems  Psych: negative for feelings of anxiety, depression  Endocrine: negative for thyroid, diabetes Allergy/Immunology- as above Objective:  VITALS:  BP 111/69   Pulse 69   Temp 97.8 F (36.6 C) (Oral)   Resp 20   Ht 5' (1.524 m)   Wt 52.2 kg   SpO2 97%  BMI 22.46 kg/m   PHYSICAL EXAM:  General: Alert, cooperative, no distress, appears stated age. Thin built Head: Normocephalic, without obvious abnormality, atraumatic. Eyes: Conjunctivae clear, anicteric sclerae. Pupils are equal ENT Nares normal. No drainage or sinus tenderness. Lips, mucosa, and tongue normal. No Thrush Neck: Supple, symmetrical, no adenopathy, thyroid: non tender no carotid bruit and no JVD. Back: No CVA tenderness. Lungs: b/l air entry Heart: Regular rate and rhythm, no murmur, rub or gallop. Abdomen: Soft, non-tender,not distended. Bowel sounds normal. Extremities: atrophic scar over the rt leg , no cyanosis. No edema. No clubbing Skin: No rashes or lesions. Or bruising Lymph: Cervical, supraclavicular normal. Neurologic: Grossly non-focal Pertinent Labs Lab Results CBC    Component Value Date/Time   WBC 5.4 11/28/2021 0340   RBC 3.66 (L) 11/28/2021 0340   HGB 11.7 (L) 11/28/2021 0340   HCT 35.2 (L) 11/28/2021 0340   PLT 173 11/28/2021 0340   MCV 96.2 11/28/2021 0340   MCH 32.0 11/28/2021 0340   MCHC 33.2 11/28/2021 0340   RDW 12.0 11/28/2021 0340   LYMPHSABS 1.2 04/16/2017 1944   MONOABS 0.4 04/16/2017 1944   EOSABS 0.2 04/16/2017 1944   BASOSABS 0.1 04/16/2017 1944       Latest Ref Rng & Units 11/28/2021    3:40 AM 11/26/2021    6:40 PM 04/16/2017    7:44 PM  CMP  Glucose 70 - 99 mg/dL 105  128  96   BUN 8 - 23 mg/dL '14  19  26   '$ Creatinine 0.44 - 1.00 mg/dL 1.13  1.36  1.10   Sodium 135 - 145 mmol/L 139  135  142   Potassium 3.5 - 5.1 mmol/L 4.5  3.6  3.6   Chloride 98 - 111 mmol/L 111  104  109   CO2 22 - 32 mmol/L '22  21  24   '$ Calcium 8.9 - 10.3 mg/dL 7.7  8.7  9.0    Total Protein 6.5 - 8.1 g/dL  7.2    Total Bilirubin 0.3 - 1.2 mg/dL  1.7    Alkaline Phos 38 - 126 U/L  39    AST 15 - 41 U/L  34    ALT 0 - 44 U/L  28        Microbiology: Recent Results (from the past 240 hour(s))  Resp Panel by RT-PCR (Flu A&B, Covid) Anterior Nasal Swab     Status: None   Collection Time: 11/27/21 12:06 AM   Specimen: Anterior Nasal Swab  Result Value Ref Range Status   SARS Coronavirus 2 by RT PCR NEGATIVE NEGATIVE Final    Comment: (NOTE) SARS-CoV-2 target nucleic acids are NOT DETECTED.  The SARS-CoV-2 RNA is generally detectable in upper respiratory specimens during the acute phase of infection. The lowest concentration of SARS-CoV-2 viral copies this assay can detect is 138 copies/mL. A negative result does not preclude SARS-Cov-2 infection and should not be used as the sole basis for treatment or other patient management decisions. A negative result may occur with  improper specimen collection/handling, submission of specimen other than nasopharyngeal swab, presence of viral mutation(s) within the areas targeted by this assay, and inadequate number of viral copies(<138 copies/mL). A negative result must be combined with clinical observations, patient history, and epidemiological information. The expected result is Negative.  Fact Sheet for Patients:  EntrepreneurPulse.com.au  Fact Sheet for Healthcare Providers:  IncredibleEmployment.be  This test is no t yet approved or cleared by the [place] and  has been authorized for detection and/or diagnosis of SARS-CoV-2 by FDA under an Emergency Use Authorization (EUA). This EUA will remain  in effect (meaning this test can be used) for the duration of the COVID-19 declaration under Section 564(b)(1) of the Act, 21 U.S.C.section 360bbb-3(b)(1), unless the authorization is terminated  or revoked sooner.       Influenza A by PCR NEGATIVE NEGATIVE Final    Influenza B by PCR NEGATIVE NEGATIVE Final    Comment: (NOTE) The Xpert Xpress SARS-CoV-2/FLU/RSV plus assay is intended as an aid in the diagnosis of influenza from Nasopharyngeal swab specimens and should not be used as a sole basis for treatment. Nasal washings and aspirates are unacceptable for Xpert Xpress SARS-CoV-2/FLU/RSV testing.  Fact Sheet for Patients: EntrepreneurPulse.com.au  Fact Sheet for Healthcare Providers: IncredibleEmployment.be  This test is not yet approved or cleared by the [place] FDA and has been authorized for detection and/or diagnosis of SARS-CoV-2 by FDA under an Emergency Use Authorization (EUA). This EUA will remain in effect (meaning this test can be used) for the duration of the COVID-19 declaration under Section 564(b)(1) of the Act, 21 U.S.C. section 360bbb-3(b)(1), unless the authorization is terminated or revoked.  Performed at [facility], [address], [place], [place] [address]   Urine Culture     Status: None   Collection Time: 11/27/21 12:06 AM   Specimen: Urine, Clean Catch  Result Value Ref Range Status   Specimen Description   Final    URINE, CLEAN CATCH Performed at [facility], [address] [place], [place] [address]    Special Requests   Final    NONE Performed at [facility], [address] [place], [place] [address]    Culture   Final    NO GROWTH Performed at [facility], [place] [address] [place], [place] [address]    Report Status 11/28/2021 FINAL  Final  Culture, blood (Routine X 2) w Reflex to ID Panel     Status: None (Preliminary result)   Collection Time: 11/27/21  2:49 PM   Specimen: BLOOD  Result Value Ref Range Status   Specimen Description BLOOD BLOOD RIGHT HAND  Final   Special Requests   Final    BOTTLES DRAWN AEROBIC AND ANAEROBIC Blood Culture results may not be optimal due to an inadequate volume of blood received in  culture bottles   Culture   Final    NO GROWTH < 24 HOURS Performed at [facility], [address], [place], [place] [address]    Report Status PENDING  Incomplete  Culture, blood (Routine X 2) w Reflex to ID Panel     Status: None (Preliminary result)   Collection Time: 11/27/21  2:49 PM   Specimen: BLOOD  Result Value Ref Range Status   Specimen Description BLOOD LEFT ANTECUBITAL  Final   Special Requests   Final    BOTTLES DRAWN AEROBIC AND ANAEROBIC Blood Culture adequate volume   Culture   Final    NO GROWTH < 24 HOURS Performed at [facility], [address] [place], [place] [address]    Report Status PENDING  Incomplete    IMAGING RESULTS:   I have personally reviewed the films from 03/03/18 ?  US kidney from 11/27/21 shows left hydonephrosis  with volume of 1185m Rt kidney is 1020m CXR no infiltrate  Impression/Recommendation ?[age] female with h/o chronic left hydronephrosis due to stag horn calculus, rt nephrolithiasis presents  with fever- had some lower back discomfort   Fever - unclear cause  Neg covd/flu UC from 11/26/21 is 1000 colonies of mixed urogenital flora US shows severe left hydronephrosis Normal leucocyte in patient with low level leucopenia Blood culture here is negative so far On ceftriaxone Could this be complicated UTI/pyonephrosis  If fever does not subside then would repeat a CT abdomen/pelvis tomorrow to look at the kidneys and then discuss with urology and IR regarding role for aspiration for culture  B/l nephrolithiasis  CKD- left kidney non functional  Livedoid vasculopathy legs   ___________________________________________________ Discussed with patient, requesting provider Note:  This document was prepared using Dragon voice recognition software and may include unintentional dictation errors.

## 2021-11-28 NOTE — Consult Note (Signed)
Urology Consult   I have been asked to see the patient by Dr. Roosevelt Locks, for evaluation and management of fever and chronic left hydronephrosis.  Chief Complaint: Fever  HPI:  Megan Shepard is a 76 y.o. female who has been followed by Dr. 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  His clinic notes are unavailable for me to review.  She presented to the ER yesterday morning with fevers and weakness.  She denies any urinary symptoms or flank pain.  Urinalysis showed greater than 50 WBC, 20-50 RBC, rare bacteria, WBC clumps, large leukocytes, nitrite negative.  Urine culture was negative.  She denies any dysuria or urgency/frequency.  She has not had problems with recurrent UTIs.  Her primary complaint today is headache.  Urologic history notable for left PCNL with Dr. Eliberto Ivory in 2006.  She has had chronic massive left-sided hydronephrosis with complete left renal atrophy and large dependent stones since at least 2015 on CT.  Most recent cross-sectional imaging with CT in January 2020 showed stable findings.  PMH: Past Medical History:  Diagnosis Date   Dysrhythmia    Fibrocystic breast disease    Lichen simplex chronicus    Microscopic hematuria    Neuropathy    Nodule of left lobe of thyroid gland    Occult blood in stools    Osteoporosis    Varicose vein    Vasculitis ([place])     Surgical History: Past Surgical History:  Procedure Laterality Date   COLONOSCOPY     COLONOSCOPY WITH PROPOFOL N/A 05/18/2015   Procedure: COLONOSCOPY WITH PROPOFOL;  Surgeon: Hulen Luster, MD;  Location: [facility] ENDOSCOPY;  Service: Gastroenterology;  Laterality: N/A;   kidney stones     PARATHYROIDECTOMY Left 10/11/2018   Procedure: PARATHYROIDECTOMY AUTOTRANSPLANT;  Surgeon: Fredirick Maudlin, MD;  Location: [facility] ORS;  Service: General;  Laterality: Left;   THYROID LOBECTOMY Left 10/11/2018    Procedure: THYROID LOBECTOMY, LEFT,;  Surgeon: Fredirick Maudlin, MD;  Location: [facility] ORS;  Service: General;  Laterality: Left;   VENOUS ABLATION    \   Allergies:  Allergies  Allergen Reactions   Erythromycin Rash    Family History: Family History  Problem Relation Age of Onset   Breast cancer Maternal Aunt        80's    Social History:  reports that she has never smoked. She has never used smokeless tobacco. She reports that she does not drink alcohol and does not use drugs.  ROS: Negative aside from those stated in the HPI.  Physical Exam: BP 111/69   Pulse 69   Temp (S) 98.7 F (37.1 C)   Resp 20   Ht 5' (1.524 m)   Wt 52.2 kg   SpO2 97%   BMI 22.46 kg/m    Constitutional:  Alert and oriented, No acute distress. Cardiovascular: No clubbing, cyanosis, or edema. Respiratory: Normal respiratory effort, no increased work of breathing. GI: Abdomen is soft, nontender, nondistended, no abdominal masses GU: No CVA tenderness  Laboratory Data: Reviewed, see HPI Creatinine 1.13(eGFR 51) stable from creatinine 1.14 years ago WBC 5.4  Pertinent Imaging: I have personally reviewed the renal ultrasound showing chronic severe left hydronephrosis with completely atrophic left kidney, as well as the prior CT from 2020 with no remaining renal parenchyma and chronic severe left hydronephrosis with large dependent stones, likely secondary to chronic UPJ obstruction.  Assessment & Plan:  76 year old female with fevers and malaise of unclear etiology.  COVID and flu testing negative, but viral etiology versus pyelonephritis on differential.  She has no UTI type symptoms, no flank pain, no CVA tenderness, and urine culture is negative.  I had a long conversation with the patient and her family today about possible etiologies.  Her left-sided severe hydronephrosis is chronic for at least 10 years, and there is no remaining renal parenchyma.  I do not see a benefit for either an  attempted ureteral stent or nephrostomy tube.  It is reasonable to continue antibiotics, and trend her fever curve.  Could consider a repeat CT scan for further evaluation of etiology if persistent fevers.  She follows yearly with Dr. Yves Dill, and has follow-up with him next month.  Recommendations: -Okay to continue antibiotics, cultures negative thus far -Do not recommend ureteral stent or nephrostomy tube in the setting of her chronic left hydronephrosis from UPJ obstruction for 10+ years with completely atrophic left kidney with no remaining parenchyma, and absence of symptoms of pyelonephritis including flank pain or dysuria -Consider CT for further evaluation if persistent fevers -Follow-up with her urologist Dr. Yves Dill next month  Billey Co, MD  Total time spent on the floor was 65 minutes, with greater than 50% spent in counseling and coordination of care with the patient and family regarding chronic left UPJ obstruction with completely atrophic left kidney, massive hydronephrosis for at least 10 years, and fever of unclear etiology.  [address] [place], [place] [address] [phone]

## 2021-11-28 NOTE — Progress Notes (Signed)
Pharmacy Lovenox Dosing  76 y.o. female admitted with Fever and Abdominal Pain . Patient ordered Lovenox 30 mg daily for VTE prophylaxis.   Filed Weights   11/27/21 0657  Weight: 52.2 kg (115 lb)    Body mass index is 22.46 kg/m.  Estimated Creatinine Clearance: 31.4 mL/min (A) (by C-G formula based on SCr of 1.13 mg/dL (H)).  Will adjust Lovenox dosing to 40 mg Q24h.   Abdishakur Gottschall A Dorota Heinrichs 11/28/2021 2:56 PM

## 2021-11-29 ENCOUNTER — Inpatient Hospital Stay: Payer: Medicare PPO

## 2021-11-29 DIAGNOSIS — N131 Hydronephrosis with ureteral stricture, not elsewhere classified: Secondary | ICD-10-CM | POA: Diagnosis not present

## 2021-11-29 DIAGNOSIS — N1330 Unspecified hydronephrosis: Secondary | ICD-10-CM | POA: Diagnosis not present

## 2021-11-29 DIAGNOSIS — R509 Fever, unspecified: Secondary | ICD-10-CM | POA: Diagnosis not present

## 2021-11-29 DIAGNOSIS — N390 Urinary tract infection, site not specified: Secondary | ICD-10-CM | POA: Diagnosis not present

## 2021-11-29 DIAGNOSIS — N1832 Chronic kidney disease, stage 3b: Secondary | ICD-10-CM | POA: Diagnosis not present

## 2021-11-29 DIAGNOSIS — N261 Atrophy of kidney (terminal): Secondary | ICD-10-CM

## 2021-11-29 LAB — RESPIRATORY PANEL BY PCR

## 2021-11-29 MED ORDER — CIPROFLOXACIN IN D5W 400 MG/200ML IV SOLN
400.0000 mg | Freq: Two times a day (BID) | INTRAVENOUS | Status: DC
  Administered 2021-11-29 – 2021-11-30 (×3): 400 mg via INTRAVENOUS
  Filled 2021-11-29 (×3): qty 200

## 2021-11-29 MED ORDER — IOHEXOL 9 MG/ML PO SOLN
500.0000 mL | ORAL | Status: AC
  Administered 2021-11-29 (×2): 500 mL via ORAL

## 2021-11-29 MED ORDER — SACCHAROMYCES BOULARDII 250 MG PO CAPS
250.0000 mg | ORAL_CAPSULE | Freq: Two times a day (BID) | ORAL | Status: DC
  Administered 2021-11-29 – 2021-12-03 (×8): 250 mg via ORAL
  Filled 2021-11-29 (×8): qty 1

## 2021-11-29 MED ORDER — POLYETHYLENE GLYCOL 3350 17 G PO PACK: 17.0000 g | PACK | Freq: Every day | ORAL | Status: DC | PRN

## 2021-11-29 MED ORDER — LACTULOSE 10 GM/15ML PO SOLN: 20.0000 g | Freq: Once | ORAL | Status: DC

## 2021-11-29 MED ORDER — SENNOSIDES-DOCUSATE SODIUM 8.6-50 MG PO TABS
2.0000 | ORAL_TABLET | Freq: Two times a day (BID) | ORAL | Status: DC
  Administered 2021-11-29 – 2021-12-01 (×5): 2 via ORAL
  Filled 2021-11-29 (×5): qty 2

## 2021-11-29 NOTE — Progress Notes (Signed)
  Progress Note   Patient: Megan Shepard [id number]:[id number] DOB: [birth year] DOA: 11/26/2021     2 DOS: the patient was seen and examined on 11/29/2021   Brief hospital course: Megan Shepard is a 76 y.o. female with medical history significant for CKD 3B, thyroid nodules followed by endocrinology s/p partial thyroidectomy, peripheral neuropathy, livedo vasculopathy who presents to the ED with a 2-day history of fever up to 104.3, low back pain and generalized malaise with protracted weakness. Upon arriving the hospital, she had a fever of 103.  She also has abnormal urine, she was started on Rocephin for UTI, urine culture sent out. 10/6 Patient renal ultrasound showed left-sided hydronephrosis, which appeared to be chronic.  Blood culture negative, urine culture negative.  Obtain CT scan of abdomen/pelvis due to persistent fever.  Assessment and Plan: Urinary tract infection Left-sided hydronephrosis secondary to kidney stone. High fever. Generalized weakness. Patient also has some back pain, she has a singular functioning kidney with a large kidney stone in the nonfunctional kidney. Renal ultrasound showed significant hydronephrosis on the left side. Blood culture and urine culture are negative. Patient has been seen by urology and ID, patient appears to have chronic left-sided hydronephrosis. Patient has been treated with Rocephin, but still has a fever.  Will obtain CT abdomen/pelvis per recommendation from ID. May consider IR drainage of left hydronephrosis after CT scan if fever still persist. Respiratory viral panel negative. Will discontinue prophylactic Lovenox in case patient need IR drain.  Constipation. Bowel protocol started.   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  Has seen rheumatology in the past   Thyroid nodules S/P partial thyroidectomy Followed by endocrinology   Neuropathy of lower extremity Continue gabapentin   Stage 3b chronic kidney disease  ([place]) Renal function stable.      Subjective:  Patient still do not feel well, has not had a fever this morning. Has some constipation without nausea vomiting, no abdominal pain.  Physical Exam: Vitals:   11/28/21 1926 11/28/21 2139 11/29/21 0255 11/29/21 0827  BP: 127/72  109/66 135/77  Pulse: 77  71 76  Resp: '20  18 18  '$ Temp: 99.5 F (37.5 C) (!) 100.7 F (38.2 C) 99.8 F (37.7 C) 99.8 F (37.7 C)  TempSrc: Oral Oral Oral Oral  SpO2: 100%  100% 97%  Weight:      Height:       General exam: Appears calm and comfortable  Respiratory system: Clear to auscultation. Respiratory effort normal. Cardiovascular system: S1 & S2 heard, RRR. No JVD, murmurs, rubs, gallops or clicks. No pedal edema. Gastrointestinal system: Abdomen is nondistended, soft and nontender. No organomegaly or masses felt. Normal bowel sounds heard. Central nervous system: Alert and oriented. No focal neurological deficits. Extremities: Symmetric 5 x 5 power. Skin: No rashes, lesions or ulcers Psychiatry: Judgement and insight appear normal. Mood & affect appropriate.   Data Reviewed:  Lab results reviewed.  Family Communication: Had a long discussion with the daughter, all questions answered.  Disposition: Status is: Inpatient Remains inpatient appropriate because: Severity of disease IV treatment.  Planned Discharge Destination: Home with Home Health    Time spent: 50 minutes  Author: Sharen Hones, MD 11/29/2021 10:02 AM  For on call review [email or web address].

## 2021-11-29 NOTE — TOC CM/SW Note (Signed)
  Transition of Care [facility]) Screening Note   Patient Details  Name: Megan Shepard Date of Birth: November 10, 1945   [facility] of [facility] CM/SW Contact:    Candie Chroman, LCSW Phone Number: 11/29/2021, 2:28 PM    Transition of Care Department [facility]) has reviewed patient and no TOC needs have been identified at this time. We will continue to monitor patient advancement through interdisciplinary progression rounds. If new patient transition needs arise, please place a TOC consult.

## 2021-11-29 NOTE — Care Management Important Message (Signed)
Important Message  Patient Details  Name: Megan Shepard MRN: [id number] Date of Birth: 01-22-1946   Medicare Important Message Given:  Yes     [name] 11/29/2021, 1:37 PM

## 2021-11-29 NOTE — Progress Notes (Signed)
Mobility Specialist - Progress Note   11/29/21 1433  Mobility  Activity Ambulated independently to bathroom  Activity Response Tolerated well  Distance Ambulated (ft) 8 ft  $Mobility charge 1 Mobility  Level of Assistance Independent  Assistive Device None  Mobility Referral Yes   Candie Mile Mobility Specialist 11/29/21 2:34 PM

## 2021-11-29 NOTE — Progress Notes (Signed)
Urology Inpatient Progress Note  Subjective: No acute events overnight. Fever curve is downtrending, most recently 37.7 C this morning.  She is normotensive. No a.m. labs available.  Blood cultures pending with no growth at 2 days.  Urine culture finalized with no growth, on antibiotics as below. She is accompanied today at the bedside by her daughter.  She reports feeling slightly better compared to yesterday.  She has had some persistent headache and fatigue.  She denies dysuria or abdominal pain and is voiding spontaneously.  Anti-infectives: Anti-infectives (From admission, onward)    Start     Dose/Rate Route Frequency Ordered Stop   11/27/21 2200  cefTRIAXone (ROCEPHIN) 1 g in sodium chloride 0.9 % 100 mL IVPB  Status:  Discontinued        1 g 200 mL/hr over 30 Minutes Intravenous Every 24 hours 11/27/21 1221 11/27/21 1428   11/27/21 2200  cefTRIAXone (ROCEPHIN) 2 g in sodium chloride 0.9 % 100 mL IVPB        2 g 200 mL/hr over 30 Minutes Intravenous Every 24 hours 11/27/21 1428     11/27/21 0245  cefTRIAXone (ROCEPHIN) 1 g in sodium chloride 0.9 % 100 mL IVPB        1 g 200 mL/hr over 30 Minutes Intravenous  Once 11/27/21 0242 11/27/21 0430       Current Facility-Administered Medications  Medication Dose Route Frequency Provider Last Rate Last Admin   acetaminophen (TYLENOL) tablet 650 mg  650 mg Oral Q6H PRN Athena Masse, MD   650 mg at 11/28/21 2142   Or   acetaminophen (TYLENOL) suppository 650 mg  650 mg Rectal Q6H PRN Athena Masse, MD       cefTRIAXone (ROCEPHIN) 2 g in sodium chloride 0.9 % 100 mL IVPB  2 g Intravenous Q24H Sharen Hones, MD 200 mL/hr at 11/28/21 2138 2 g at 11/28/21 2138   enoxaparin (LOVENOX) injection 40 mg  40 mg Subcutaneous Q24H Rito Ehrlich A, RPH   40 mg at 11/29/21 0824   HYDROcodone-acetaminophen (NORCO/VICODIN) 5-325 MG per tablet 1-2 tablet  1-2 tablet Oral Q4H PRN Athena Masse, MD       lactated ringers infusion   Intravenous  Continuous Sharen Hones, MD 50 mL/hr at 11/28/21 2321 New Bag at 11/28/21 2321   ondansetron (ZOFRAN) tablet 4 mg  4 mg Oral Q6H PRN Athena Masse, MD       Or   ondansetron [facility]) injection 4 mg  4 mg Intravenous Q6H PRN Athena Masse, MD       Objective: Vital signs in last 24 hours: Temp:  [97.6 F (36.4 C)-101.2 F (38.4 C)] 99.8 F (37.7 C) (10/06 0827) Pulse Rate:  [71-77] 76 (10/06 0827) Resp:  [16-20] 18 (10/06 0827) BP: (109-135)/(66-77) 135/77 (10/06 0827) SpO2:  [97 %-100 %] 97 % (10/06 0827)  Intake/Output from previous day: 10/05 0701 - 10/06 0700 In: 726.3 [P.O.:120; I.V.:606.3] Out: -  Intake/Output this shift: No intake/output data recorded.  Physical Exam Vitals and nursing note reviewed.  Constitutional:      General: She is not in acute distress.    Appearance: She is not ill-appearing, toxic-appearing or diaphoretic.  HENT:     Head: Normocephalic and atraumatic.  Pulmonary:     Effort: Pulmonary effort is normal. No respiratory distress.  Skin:    General: Skin is warm and dry.  Neurological:     Mental Status: She is alert and oriented to person, place,  and time.  Psychiatric:        Mood and Affect: Mood normal.        Behavior: Behavior normal.     Lab Results:  Recent Labs    11/26/21 1840 11/28/21 0340  WBC 8.5 5.4  HGB 13.4 11.7*  HCT 39.0 35.2*  PLT 189 173   BMET Recent Labs    11/26/21 1840 11/28/21 0340  NA 135 139  K 3.6 4.5  CL 104 111  CO2 21* 22  GLUCOSE 128* 105*  BUN 19 14  CREATININE 1.36* 1.13*  CALCIUM 8.7* 7.7*   Studies/Results: US RENAL  Result Date: 11/27/2021 CLINICAL DATA:  Pyelonephritis EXAM: RENAL / URINARY TRACT ULTRASOUND COMPLETE COMPARISON:  CT 03/03/2018, 02/20/2014 FINDINGS: Right Kidney: Renal measurements: 10.2 x 4.4 x 4.7 cm = volume: 109 mL. Echogenicity within normal limits. Multiple echogenic, shadowing stones within the right kidney, largest measuring up to 5 mm. No mass or  hydronephrosis visualized. Left Kidney: Renal measurements: Approximately 19.7 x 12.1 x 9.3 cm = volume: 1,166 mL. Chronic severe hydronephrosis of the left kidney. No discernible renal cortex is visualized. Bladder: Appears normal for degree of bladder distention. Right ureteral jet was visualized. Other: None. IMPRESSION: 1. Right nephrolithiasis without evidence of obstructive uropathy. 2. Chronic severe hydronephrosis of the left kidney. 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 Electronically Signed   By: Davina Poke D.O.   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  Urine culture is negative.  She is clinically improving and her fever curve is downtrending on empiric antibiotics.  Agree with continued antibiotic therapy.  If she has recurrent high fevers over 102 F, recommend CT for further evaluation.  She will require outpatient follow-up with Dr. Yves Dill.  Debroah Loop, PA-C 11/29/2021

## 2021-11-29 NOTE — Plan of Care (Signed)

## 2021-11-29 NOTE — Progress Notes (Signed)
Date of Admission:  11/26/2021      ID: Megan Shepard is a 76 y.o. female  Principal Problem:   Urinary tract infection Active Problems:   Stage 3b chronic kidney disease (H[facility])   Neuropathy of lower extremity   Thyroid nodules S/P partial thyroidectomy   Generalized weakness   Livedoid vasculopathy   Hydronephrosis of left kidney  Daughter at bedside  Subjective: Had some fever Dry cough No pain abdomen or flank pain  Medications:   senna-docusate  2 tablet Oral BID    Objective: Vital signs in last 24 hours: Temp:  [99 F (37.2 C)-101.2 F (38.4 C)] 99.8 F (37.7 C) (10/06 0827) Pulse Rate:  [71-77] 76 (10/06 0827) Resp:  [16-20] 18 (10/06 0827) BP: (109-135)/(66-77) 135/77 (10/06 0827) SpO2:  [97 %-100 %] 97 % (10/06 0827)    PHYSICAL EXAM:  General: Alert, cooperative, no distress, appears stated age.  Head: Normocephalic, without obvious abnormality, atraumatic. Eyes: Conjunctivae clear, anicteric sclerae. Pupils are equal ENT Nares normal. No drainage or sinus tenderness. Lips, mucosa, and tongue normal. No Thrush Neck: Supple, symmetrical, no adenopathy, thyroid: non tender no carotid bruit and no JVD. Back: No CVA tenderness. Lungs: Clear to auscultation bilaterally. No Wheezing or Rhonchi. No rales. Heart: Regular rate and rhythm, no murmur, rub or gallop. Abdomen: Soft, non-tender,not distended. Bowel sounds normal. No masses Extremities: atraumatic, no cyanosis. No edema. No clubbing Skin: No rashes or lesions. Or bruising Lymph: Cervical, supraclavicular normal. Neurologic: Grossly non-focal  Lab Results Recent Labs    11/26/21 1840 11/28/21 0340  WBC 8.5 5.4  HGB 13.4 11.7*  HCT 39.0 35.2*  NA 135 139  K 3.6 4.5  CL 104 111  CO2 21* 22  BUN 19 14  CREATININE 1.36* 1.13*   Liver Panel Recent Labs    11/26/21 1840  PROT 7.2  ALBUMIN 3.5  AST 34  ALT 28  ALKPHOS 39  BILITOT 1.7*   Micro BC/UC neg  Studies/Results: CT  ABDOMEN PELVIS WO CONTRAST  Result Date: 11/29/2021 CLINICAL DATA:  Pyelonephritis, chronic renal insufficiency. EXAM: CT ABDOMEN AND PELVIS WITHOUT CONTRAST TECHNIQUE: Multidetector CT imaging of the abdomen and pelvis was performed following the standard protocol without IV contrast. RADIATION DOSE REDUCTION: This exam was performed according to the departmental dose-optimization program which includes automated exposure control, adjustment of the mA and/or kV according to patient size and/or use of iterative reconstruction technique. COMPARISON:  March 03, 2018 and previous imaging from 2015. Also there are prior images from June of 2006. FINDINGS: Lower chest: Lung bases are clear. No effusion. No consolidative changes. Hepatobiliary: Smooth hepatic contours. No visible lesion. No pericholecystic stranding. No gross biliary duct distension. Pancreas: Pancreas with normal contours, no signs of inflammation or peripancreatic fluid. Spleen: Normal. Adrenals/Urinary Tract: Adrenal glands are normal. Marked LEFT hydronephrosis with complete loss of renal parenchyma. Layering small stones in calcification within dilated collecting systems of the chronically hydronephrotic LEFT kidney. Transition at the LEFT UPJ. Mild LEFT perinephric stranding is new however compared to previous imaging. Kidney measuring 11 x 8.7 cm as compared to 11 x 8.8 cm in the interpolar aspect of the LEFT kidney. 18-19 cm greatest craniocaudal dimension is also similar to previous imaging. Nephrolithiasis on the RIGHT with multiple intrarenal calculi similar to previous imaging. No RIGHT-sided ureteral calculi. No hydronephrosis on the RIGHT. Urinary bladder is partially collapsed without substantial adjacent stranding. Stomach/Bowel: Small hiatal hernia. No signs of bowel dilation or inflammation involving small bowel. No  sign of obstruction of the colon. Ingested contrast passes into the proximal colon. Appendix not visualized but no  secondary signs to suggest acute appendiceal process Vascular/Lymphatic: No abdominal aortic dilation. No frank adenopathy in the retroperitoneum or in the pelvis. Reproductive: Unremarkable by CT. Other: No free air or ascites. Musculoskeletal: Osteopenia.  No acute or destructive bone process. IMPRESSION: 1. 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 Likely due to stricture based on previous imaging due to stone disease (imaging from 2009). Correlate with any signs of urinary tract infection. If there are worsening LEFT-sided symptoms the risk of pyelonephritis complicated by sepsis due to chronic obstruction of the LEFT kidney may be higher than simple pyelonephritis. 2. Nephrolithiasis on the RIGHT with multiple intrarenal calculi similar to previous imaging. No RIGHT-sided ureteral calculi. 3. Small hiatal hernia. Electronically Signed   By: Zetta Bills M.D.   On: 11/29/2021 13:48   US RENAL  Result Date: 11/27/2021 CLINICAL DATA:  Pyelonephritis EXAM: RENAL / URINARY TRACT ULTRASOUND COMPLETE COMPARISON:  CT 03/03/2018, 02/20/2014 FINDINGS: Right Kidney: Renal measurements: 10.2 x 4.4 x 4.7 cm = volume: 109 mL. Echogenicity within normal limits. Multiple echogenic, shadowing stones within the right kidney, largest measuring up to 5 mm. No mass or hydronephrosis visualized. Left Kidney: Renal measurements: Approximately 19.7 x 12.1 x 9.3 cm = volume: 1,166 mL. Chronic severe hydronephrosis of the left kidney. No discernible renal cortex is visualized. Bladder: Appears normal for degree of bladder distention. Right ureteral jet was visualized. Other: None. IMPRESSION: 1. Right nephrolithiasis without evidence of obstructive uropathy. 2. Chronic severe hydronephrosis of the left kidney. 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 Electronically Signed   By: Davina Poke  D.O.   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, rt nephrolithiasis presents with fever- had some lower back discomfort    Fever - unclear cause  Could this be complicated UTI/pyonephrosis  Neg covd/flu UC from 11/26/21 is 10000 colonies of mixed urogenital flora US shows severe left hydronephrosis Normal leucocyte in patient with low level leucopenia Blood culture /UC here is negative so far On ceftriaxone, fever curve improving - change to cipro '500mg'$  PO BID for 5 more days until 12/04/21   repeat a CT abdomen/pelvis shows the same severe hydronephrosis of left kidney with stone   B/l nephrolithiasis   CKD- left kidney non functional   Livedoid vasculopathy legs  Discussed the management with the patient and her daughter- showed them the CT scan of the kidney ID will follow peripherally this weekend- call [facility] if needed

## 2021-11-30 DIAGNOSIS — N1330 Unspecified hydronephrosis: Secondary | ICD-10-CM | POA: Diagnosis not present

## 2021-11-30 DIAGNOSIS — N390 Urinary tract infection, site not specified: Secondary | ICD-10-CM | POA: Diagnosis not present

## 2021-11-30 DIAGNOSIS — R509 Fever, unspecified: Secondary | ICD-10-CM | POA: Diagnosis not present

## 2021-11-30 LAB — GLUCOSE, CAPILLARY: Glucose-Capillary: 97 mg/dL (ref 70–99)

## 2021-11-30 LAB — PROCALCITONIN: Procalcitonin: 0.21 ng/mL

## 2021-11-30 MED ORDER — GUAIFENESIN-DM 100-10 MG/5ML PO SYRP: 5.0000 mL | ORAL_SOLUTION | ORAL | Status: DC | PRN

## 2021-11-30 NOTE — Progress Notes (Signed)
Progress Note   Patient: Megan Shepard VVO:[id number] DOB: 12/22/1945 DOA: 11/26/2021     3 DOS: the patient was seen and examined on 11/30/2021   Brief hospital course: Megan Shepard is a 76 y.o. female with medical history significant for CKD 3B, thyroid nodules followed by endocrinology s/p partial thyroidectomy, peripheral neuropathy, livedo vasculopathy who presents to the ED with a 2-day history of fever up to 104.3, low back pain and generalized malaise with protracted weakness. Upon arriving the hospital, she had a fever of 103.  She also has abnormal urine, she was started on Rocephin for UTI, urine culture sent out. 10/6 Patient renal ultrasound showed left-sided hydronephrosis, which appeared to be chronic.  Blood culture negative, urine culture negative.     Assessment and Plan:  Urinary tract infection Left-sided hydronephrosis secondary to kidney stone. High fever. Generalized weakness. Patient also has some back pain, she has a singular functioning kidney with a large kidney stone in the nonfunctional kidney. Renal ultrasound showed significant hydronephrosis on the left side. Blood culture and urine culture are negative. Patient has been seen by urology and ID, patient appears to have chronic left-sided hydronephrosis. CT scan performed on 10/6 showed hydronephrosis of the left side.  Discussed with urology, this is a similar to CT scan about 10 years ago.  They do not believe patient had a infected hydronephrosis. However, patient fever persisted.  Discussed with Dr. Gale Journey, on-call infectious disease physician, recommended repeat blood culture x2, echocardiogram, may consider TEE if negative, nuclear scan for abscess.  He also recommended discontinue antibiotics to observe in case patient has drug induced fever. Patient also had a history of right lower extremity vasculitis, currently does not seem to be active.  But I will check sed rate, CRP and ANCA level.    Constipation. Had a bowel movement this morning after bowel protocol.   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  Has seen rheumatology in the past   Thyroid nodules S/P partial thyroidectomy Followed by endocrinology   Neuropathy of lower extremity Continue gabapentin   Stage 3b chronic kidney disease ([place]) Renal function stable.      Subjective:  Patient still had a fever last night, currently feeling better today.  No abdominal pain or nausea vomiting.  Still has not recorded bowel movement.  Physical Exam: Vitals:   11/29/21 2130 11/30/21 0539 11/30/21 0818 11/30/21 1221  BP:  118/70 130/77 125/66  Pulse:  73 74 69  Resp:  16  14  Temp: 98.9 F (37.2 C) 98.9 F (37.2 C) 98.4 F (36.9 C) 98.5 F (36.9 C)  TempSrc: Oral Oral Oral Oral  SpO2:  93% 99% 100%  Weight:      Height:       General exam: Appears calm and comfortable  Respiratory system: Clear to auscultation. Respiratory effort normal. Cardiovascular system: S1 & S2 heard, RRR. No JVD, murmurs, rubs, gallops or clicks. No pedal edema. Gastrointestinal system: Abdomen is nondistended, soft and nontender. No organomegaly or masses felt. Normal bowel sounds heard. Central nervous system: Alert and oriented. No focal neurological deficits. Extremities: Symmetric 5 x 5 power. Skin: No rashes, lesions or ulcers Psychiatry: Judgement and insight appear normal. Mood & affect appropriate.   Data Reviewed:  Lab results reviewed.  Family Communication: Daughter updated at bedside.  Disposition: Status is: Inpatient Remains inpatient appropriate because: Severity of disease,  Planned Discharge Destination: Home    Time spent: 50 minutes  Author: Sharen Hones, MD 11/30/2021 2:42 PM  For  on call review [email or web address].

## 2021-11-30 NOTE — Progress Notes (Signed)
Per Dr. Roosevelt Locks okay to leave out patient's IV.

## 2021-12-01 ENCOUNTER — Inpatient Hospital Stay
Admit: 2021-12-01 | Discharge: 2021-12-01 | Disposition: A | Discharge status: Home / Self Care | Payer: Medicare PPO | Attending: Internal Medicine | Admitting: Internal Medicine

## 2021-12-01 DIAGNOSIS — N390 Urinary tract infection, site not specified: Secondary | ICD-10-CM | POA: Diagnosis not present

## 2021-12-01 DIAGNOSIS — N1832 Chronic kidney disease, stage 3b: Secondary | ICD-10-CM | POA: Diagnosis not present

## 2021-12-01 DIAGNOSIS — R509 Fever, unspecified: Secondary | ICD-10-CM | POA: Diagnosis not present

## 2021-12-01 LAB — COMPREHENSIVE METABOLIC PANEL
ALT: 48 U/L — ABNORMAL HIGH (ref 0–44)
AST: 32 U/L (ref 15–41)
Albumin: 2.6 g/dL — ABNORMAL LOW (ref 3.5–5.0)
Alkaline Phosphatase: 58 U/L (ref 38–126)
Anion gap: 8 (ref 5–15)
BUN: 12 mg/dL (ref 8–23)
CO2: 22 mmol/L (ref 22–32)
Calcium: 8.9 mg/dL (ref 8.9–10.3)
Chloride: 105 mmol/L (ref 98–111)
Creatinine, Ser: 0.96 mg/dL (ref 0.44–1.00)
GFR, Estimated: >60 mL/min (ref >60)
Glucose, Bld: 96 mg/dL (ref 70–99)
Potassium: 4.2 mmol/L (ref 3.5–5.1)
Sodium: 135 mmol/L (ref 135–145)
Total Bilirubin: 0.7 mg/dL (ref 0.3–1.2)
Total Protein: 6 g/dL — ABNORMAL LOW (ref 6.5–8.1)

## 2021-12-01 LAB — C-REACTIVE PROTEIN: CRP: 11.7 mg/dL — ABNORMAL HIGH (ref <1.0)

## 2021-12-01 LAB — SEDIMENTATION RATE: Sed Rate: 66 mm/hr — ABNORMAL HIGH (ref 0–30)

## 2021-12-01 MED ORDER — POLYETHYLENE GLYCOL 3350 17 G PO PACK
17.0000 g | PACK | Freq: Every day | ORAL | Status: DC
  Filled 2021-12-01: qty 1

## 2021-12-01 NOTE — Progress Notes (Signed)
Progress Note   Patient: Megan Shepard UDJ:[id number] DOB: 04/10/1945 DOA: 11/26/2021     4 DOS: the patient was seen and examined on 12/01/2021   Brief hospital course: Megan Shepard is a 76 y.o. female with medical history significant for CKD 3B, thyroid nodules followed by endocrinology s/p partial thyroidectomy, peripheral neuropathy, livedo vasculopathy who presents to the ED with a 2-day history of fever up to 104.3, low back pain and generalized malaise with protracted weakness. Upon arriving the hospital, she had a fever of 103.  She also has abnormal urine, she was started on Rocephin for UTI, urine culture sent out. 10/6 Patient renal ultrasound showed left-sided hydronephrosis, which appeared to be chronic.  Blood culture negative, urine culture negative.   10/7.  Still had a fever, discussed with infectious disease, recommended discontinue all antibiotics.  Obtain echocardiogram, repeat blood culture and obtain NM abscess scan.  Assessment and Plan: Urinary tract infection Left-sided hydronephrosis secondary to kidney stone. High fever. Generalized weakness. Patient also has some back pain, she has a singular functioning kidney with a large kidney stone in the nonfunctional kidney. Renal ultrasound showed significant hydronephrosis on the left side. Blood culture and urine culture are negative. Patient has been seen by urology and ID, patient appears to have chronic left-sided hydronephrosis. CT scan performed on 10/6 showed hydronephrosis of the left side.  Discussed with urology, this is a similar to CT scan about 10 years ago.  They do not believe patient had a infected hydronephrosis. However, patient fever persisted.  Discussed with Dr. Gale Journey, on-call infectious disease physician, recommended repeat blood culture x2, echocardiogram, may consider TEE if negative, nuclear scan for abscess.  He also recommended discontinue antibiotics to observe in case patient has drug induced  fever. Fever seems to be better today with a Tmax of 100.2.  Repeated blood cultures so far had no growth.  Echocardiogram is pending results. Patient also had a history of vasculitis, sed rate elevated 66, ANCA and CRP are pending. Patient is scheduled to have nuclear scan tomorrow.   Constipation. Continue bowel protocol.   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  Has seen rheumatology in the past   Thyroid nodules S/P partial thyroidectomy Followed by endocrinology   Neuropathy of lower extremity Continue gabapentin   Stage 3b chronic kidney disease ([place]) Renal function stable.      Subjective:  Patient had a fever of 100.2 last night, patient was asymptomatic at that time.   Physical Exam: Vitals:   11/30/21 1605 11/30/21 1907 12/01/21 0442 12/01/21 0756  BP: 127/68 128/71 105/67 113/72  Pulse: 78 88 76 70  Resp:  '20 16 16  '$ Temp: 99.9 F (37.7 C) 100.2 F (37.9 C) 99.7 F (37.6 C) 99 F (37.2 C)  TempSrc: Oral Oral    SpO2: 100% 100% 97% 96%  Weight:      Height:       General exam: Appears calm and comfortable  Respiratory system: Clear to auscultation. Respiratory effort normal. Cardiovascular system: S1 & S2 heard, RRR. No JVD, murmurs, rubs, gallops or clicks. No pedal edema. Gastrointestinal system: Abdomen is nondistended, soft and nontender. No organomegaly or masses felt. Normal bowel sounds heard. Central nervous system: Alert and oriented. No focal neurological deficits. Extremities: Symmetric 5 x 5 power. Skin: No rashes, lesions or ulcers Psychiatry: Judgement and insight appear normal. Mood & affect appropriate.   Data Reviewed:  Lab results reviewed.  Family Communication: Granddaughter updated at bedside.  Disposition: Status is: Inpatient Remains  inpatient appropriate because: Severity of disease, pending inpatient procedure.  Planned Discharge Destination: Home with Home Health    Time spent: 35 minutes  Author: Sharen Hones, MD 12/01/2021 1:54 PM  For on call review [email or web address].

## 2021-12-01 NOTE — Plan of Care (Signed)

## 2021-12-01 NOTE — Progress Notes (Signed)
*  PRELIMINARY RESULTS* Echocardiogram 2D Echocardiogram has been performed.  Megan Shepard 12/01/2021, 1:26 PM

## 2021-12-02 ENCOUNTER — Inpatient Hospital Stay: Payer: Medicare PPO

## 2021-12-02 DIAGNOSIS — N1832 Chronic kidney disease, stage 3b: Secondary | ICD-10-CM | POA: Diagnosis not present

## 2021-12-02 DIAGNOSIS — R509 Fever, unspecified: Secondary | ICD-10-CM | POA: Diagnosis not present

## 2021-12-02 DIAGNOSIS — N390 Urinary tract infection, site not specified: Secondary | ICD-10-CM | POA: Diagnosis not present

## 2021-12-02 LAB — ECHOCARDIOGRAM COMPLETE
AR max vel: 2.01 cm2
AV Peak grad: 9.1 mmHg
Ao pk vel: 1.51 m/s
Area-P 1/2: 3.59 cm2
Height: 60 in
S' Lateral: 2.5 cm
Single Plane A4C EF: 59.4 %
Weight: 1840 oz

## 2021-12-02 LAB — CULTURE, BLOOD (ROUTINE X 2)
Culture: NO GROWTH
Culture: NO GROWTH
Special Requests: ADEQUATE

## 2021-12-02 LAB — ANCA PROFILE
Anti-MPO Antibodies: <0.2 units (ref 0.0–0.9)
Anti-PR3 Antibodies: <0.2 units (ref 0.0–0.9)
Atypical P-ANCA titer: <1:20 {titer}
C-ANCA: <1:20 {titer}
P-ANCA: <1:20 {titer}

## 2021-12-02 MED ORDER — INDIUM IN-111 OXYQUINOLINE (OXINE) INJECTION
500.0000 | Freq: Once | INTRAVENOUS | Status: AC | PRN
  Administered 2021-12-02: 392 via INTRAVENOUS

## 2021-12-02 NOTE — Progress Notes (Signed)
   Date of Admission:  11/26/2021      ID: Megan Shepard is a 76 y.o. female  Principal Problem:   Complicated UTI (urinary tract infection) Active Problems:   Stage 3b chronic kidney disease ([facility])   Neuropathy of lower extremity   Thyroid nodules S/P partial thyroidectomy   Generalized weakness   Livedoid vasculopathy   Hydronephrosis of left kidney   Fever   Subjective: Feeling better no fever today   She is undergoing tagged wbc scan Antibiotics have been stopped Medications:   lactulose  20 g Oral Once   polyethylene glycol  17 g Oral Daily   saccharomyces boulardii  250 mg Oral BID   senna-docusate  2 tablet Oral BID    Objective: Vital signs in last 24 hours: Temp:  [98.3 F (36.8 C)-100.7 F (38.2 C)] 98.3 F (36.8 C) (10/09 0817) Pulse Rate:  [67-76] 73 (10/09 0817) Resp:  [18-20] 18 (10/09 0817) BP: (95-130)/(52-81) 95/52 (10/09 0817) SpO2:  [94 %-99 %] 99 % (10/09 0817)    PHYSICAL EXAM:  General: Alert, cooperative, no distress, appears stated age.  Lungs: Clear to auscultation bilaterally. No Wheezing or Rhonchi. No rales. Heart: Regular rate and rhythm, no murmur, rub or gallop. Abdomen: Soft, non-tender,not distended. Bowel sounds normal. No masses Extremities: atraumatic, no cyanosis. No edema. No clubbing Skin: atrophic hypopigmented scar rt leg Lymph: Cervical, supraclavicular normal. Neurologic: Grossly non-focal  Lab Results Recent Labs    12/01/21 0648  NA 135  K 4.2  CL 105  CO2 22  BUN 12  CREATININE 0.96   Liver Panel Recent Labs    12/01/21 0648  PROT 6.0*  ALBUMIN 2.6*  AST 32  ALT 48*  ALKPHOS 58  BILITOT 0.7   Micro BC/UC neg  Studies/Results: No results found.   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, rt nephrolithiasis presents with fever- had some lower back discomfort    Fever - unclear cause  Could this be complicated UTI/pyonephrosis  Neg  covd/flu UC from 11/26/21 is 10000 colonies of mixed urogenital flora US shows severe left hydronephrosis Normal leucocyte in patient with low level leucopenia Blood culture /UC here is negative so far On ceftriaxone, and then levaquin for 2 days and all antibiotics stopped Tagged wbc scan was done today( doubt it will be yielding because of low WBC Agree with discontinuing antibiotics   repeat a CT abdomen/pelvis shows the same severe hydronephrosis of left kidney with stone   B/l nephrolithiasis   CKD- left kidney non functional   Livedoid vasculopathy legs  Discussed the management with the patient and care team Follow recent test results

## 2021-12-02 NOTE — Care Management Important Message (Signed)
Important Message  Patient Details  Name: Valincia Touch MRN: [id number] Date of Birth: Jul 15, 1945   Medicare Important Message Given:  Yes     [name] 12/02/2021, 11:19 AM

## 2021-12-02 NOTE — TOC Progression Note (Signed)
Transition of Care [facility]) - Progression Note    Patient Details  Name: Marcia Hartwell MRN: [id number] Date of Birth: 1945-06-08  [facility] CM/SW Contact  Beverly Sessions, RN Phone Number: 12/02/2021, 5:37 PM  Clinical Narrative:     Please consult TOC if home IV antibiotics indicated at discharge        Expected Discharge Plan and Services                                                 Social Determinants of Health (SDOH) Interventions    Readmission Risk Interventions     No data to display

## 2021-12-02 NOTE — Progress Notes (Signed)
Progress Note   Patient: Megan Shepard [id number]:[id number] DOB: 1945/08/25 DOA: 11/26/2021     5 DOS: the patient was seen and examined on 12/02/2021   Brief hospital course: Megan Shepard is a 76 y.o. female with medical history significant for CKD 3B, thyroid nodules followed by endocrinology s/p partial thyroidectomy, peripheral neuropathy, livedo vasculopathy who presents to the ED with a 2-day history of fever up to 104.3, low back pain and generalized malaise with protracted weakness. Upon arriving the hospital, she had a fever of 103.  She also has abnormal urine, she was started on Rocephin for UTI, urine culture sent out. 10/6 Patient renal ultrasound showed left-sided hydronephrosis, which appeared to be chronic.  Blood culture negative, urine culture negative.   10/7.  Still had a fever, discussed with infectious disease, recommended discontinue all antibiotics.  Obtain echocardiogram, repeat blood culture and obtain NM abscess scan.  Assessment and Plan: Urinary tract infection Left-sided hydronephrosis secondary to kidney stone. High fever. Generalized weakness. Patient also has some back pain, she has a singular functioning kidney with a large kidney stone in the nonfunctional kidney. Renal ultrasound showed significant hydronephrosis on the left side. Blood culture and urine culture are negative. Patient has been seen by urology and ID, patient appears to have chronic left-sided hydronephrosis. CT scan performed on 10/6 showed hydronephrosis of the left side.  Discussed with urology, this is a similar to CT scan about 10 years ago.  They do not believe patient had a infected hydronephrosis. However, patient fever persisted.  Discussed with Dr. Gale Journey, on-call infectious disease physician, recommended repeat blood culture x2, echocardiogram, may consider TEE if negative, nuclear scan for abscess.  He also recommended discontinue antibiotics to observe in case patient has drug induced  fever. Patient still had a fever with Tmax of 100.7.  Patient was asymptomatic. Blood culture repeated it was negative.  Echocardiogram still pending.  Nuclear scan started today. Continue hold antibiotics. Discussed with patient urologist Dr. Yves Dill, per family request, he also look at the patient to see images, does not see any changes indicating infection. Patient has elevated sed rate and CRP.  Still pending ANCA study. Once patient nuclear scan is completed tomorrow, will discuss again with ID, consider discharge home and to follow as outpatient with ID.   Constipation. Continue bowel protocol.   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  Has seen rheumatology in the past   Thyroid nodules S/P partial thyroidectomy Followed by endocrinology   Neuropathy of lower extremity Continue gabapentin   Stage 3b chronic kidney disease ([place]) Renal function stable.        Subjective:  Patient had a fever of 100.7 last night, asymptomatic. No other symptoms.  Physical Exam: Vitals:   12/01/21 1534 12/01/21 2116 12/02/21 0432 12/02/21 0817  BP: 107/65 130/81 107/72 (!) 95/52  Pulse: 69 76 67 73  Resp: '18 20 20 18  '$ Temp: 98.7 F (37.1 C) (!) 100.7 F (38.2 C) 99.3 F (37.4 C) 98.3 F (36.8 C)  TempSrc: Oral Oral Oral Oral  SpO2: 100% 96% 94% 99%  Weight:      Height:       General exam: Appears calm and comfortable  Respiratory system: Clear to auscultation. Respiratory effort normal. Cardiovascular system: S1 & S2 heard, RRR. No JVD, murmurs, rubs, gallops or clicks. No pedal edema. Gastrointestinal system: Abdomen is nondistended, soft and nontender. No organomegaly or masses felt. Normal bowel sounds heard. Central nervous system: Alert and oriented. No focal neurological deficits. Extremities:  Symmetric 5 x 5 power. Skin: No rashes, lesions or ulcers Psychiatry: Judgement and insight appear normal. Mood & affect appropriate.   Data Reviewed:  Lab results  reviewed.  Family Communication: Daughter updated at bedside.  Disposition: Status is: Inpatient Remains inpatient appropriate because: Severity of disease, persistent fever.  Planned Discharge Destination: Home with Home Health    Time spent: 50 minutes  Author: Sharen Hones, MD 12/02/2021 2:12 PM  For on call review [email or web address].

## 2021-12-03 DIAGNOSIS — R509 Fever, unspecified: Secondary | ICD-10-CM | POA: Diagnosis not present

## 2021-12-03 DIAGNOSIS — N390 Urinary tract infection, site not specified: Secondary | ICD-10-CM | POA: Diagnosis not present

## 2021-12-03 DIAGNOSIS — N1832 Chronic kidney disease, stage 3b: Secondary | ICD-10-CM | POA: Diagnosis not present

## 2021-12-03 NOTE — Discharge Summary (Signed)
Physician Discharge Summary   Patient: Megan Shepard MRN: [id number] DOB: Feb 03, 1946  Admit date:     11/26/2021  Discharge date: 12/03/21  Discharge Physician: Sharen Hones   PCP: Adin Hector, MD   Recommendations at discharge:   Follow-up with PCP in 1 week. Follow-up with urology Dr. Yves Dill in 2 weeks.  Discharge Diagnoses: Principal Problem:   Complicated UTI (urinary tract infection) Active Problems:   Generalized weakness   Stage 3b chronic kidney disease (HCC)   Neuropathy of lower extremity   Thyroid nodules S/P partial thyroidectomy   Livedoid vasculopathy   Hydronephrosis of left kidney   Fever  Resolved Problems:   * No resolved hospital problems. *  Hospital Course: Megan Shepard is a 76 y.o. female with medical history significant for CKD 3B, thyroid nodules followed by endocrinology s/p partial thyroidectomy, peripheral neuropathy, livedo vasculopathy who presents to the ED with a 2-day history of fever up to 104.3, low back pain and generalized malaise with protracted weakness. Upon arriving the hospital, she had a fever of 103.  She also has abnormal urine, she was started on Rocephin for UTI, urine culture sent out. 10/6 Patient renal ultrasound showed left-sided hydronephrosis, which appeared to be chronic.  Blood culture negative, urine culture negative.   10/7.  Still had a fever, discussed with infectious disease, recommended discontinue all antibiotics.  Obtain echocardiogram, repeat blood culture and obtain NM abscess scan. 10/10.  Echocardiogram did not show any evidence of intraventricular or valvular vegetation. Nuclear scan showed delayed accumulation of WBC in left kidney.  Discussed with Dr. Steva Ready and Diamantina Providence, this is most likely due to delayed clearance with nonfunctional kidney.  No real infection.  At this point, she will be discharged home with follow-ups with her urologist Dr. Yves Dill.    Assessment and Plan:   Urinary tract  infection Left-sided hydronephrosis secondary to kidney stone. High fever. Generalized weakness. Patient also has some back pain, she has a singular functioning kidney with a large kidney stone in the nonfunctional kidney. Renal ultrasound showed significant hydronephrosis on the left side. Blood culture and urine culture are negative. Patient has been seen by urology and ID, patient appears to have chronic left-sided hydronephrosis. CT scan performed on 10/6 showed hydronephrosis of the left side.  Discussed with urology, this is a similar to CT scan about 10 years ago.  They do not believe patient had a infected hydronephrosis. However, patient fever persisted.  Discussed with Dr. Gale Journey, on-call infectious disease physician, recommended repeat blood culture x2, echocardiogram, may consider TEE if negative, nuclear scan for abscess.  He also recommended discontinue antibiotics to observe in case patient has drug induced fever. After discussion with Dr. Gale Journey, we repeated blood culture was negative, echocardiogram did not show any vegetation.  Nuclear scan showed slow localization of WBC in left kidney.  However, this is unlikely to be a source of infection. Patient also had elevated sed rate and CRP, but negative ANCA.  Not supporting diagnosis of vasculitis. At this point, the diagnosis still uncertain, she could have a viral infection, she has not had a fever for at least 36 hours. She will follow-up with PCP and urology as outpatient.   Constipation. Continue bowel protocol.   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  Has seen rheumatology in the past   Thyroid nodules S/P partial thyroidectomy Followed by endocrinology   Neuropathy of lower extremity Continue gabapentin   Stage 3b chronic kidney disease ([place]) Mild metabolic acidosis. Renal function  stable.      Consultants: Urology, ID. Procedures performed: None  Disposition: Home Diet recommendation:  Discharge Diet Orders  (From admission, onward)     Start     Ordered   12/03/21 0000  Diet - low sodium heart healthy        12/03/21 1521           Cardiac diet DISCHARGE MEDICATION: Allergies as of 12/03/2021       Reactions   Erythromycin Rash        Medication List     STOP taking these medications    cephALEXin 500 MG capsule Commonly known as: KEFLEX       TAKE these medications    alendronate 70 MG tablet Commonly known as: FOSAMAX Take 70 mg by mouth every Sunday. Take with a full glass of water on an empty stomach.   ascorbic acid 500 MG tablet Commonly known as: VITAMIN C Take 1,000 mg by mouth 2 (two) times daily.   aspirin 81 MG tablet Take 81 mg by mouth 2 (two) times a week.   CALCIUM 600+D3 PO Take 1 tablet by mouth 2 (two) times daily.   Magnesium 500 MG Tabs Take 250 mg by mouth in the morning and at bedtime.   PROBIOTIC PO Take 1 capsule by mouth daily.   Vitamin D3 25 MCG (1000 UT) Caps Take 1,000 Units by mouth daily.        Follow-up Information     Tama High III, MD Follow up in 1 week(s).   Specialty: Internal Medicine Contact information: [place] [address] [phone]         Royston Cowper, MD Follow up in 1 week(s).   Specialty: Urology Contact information: [place] [address] [phone]                Discharge Exam: Danley Danker Weights   11/27/21 0657  Weight: 52.2 kg   General exam: Appears calm and comfortable  Respiratory system: Clear to auscultation. Respiratory effort normal. Cardiovascular system: S1 & S2 heard, RRR. No JVD, murmurs, rubs, gallops or clicks. No pedal edema. Gastrointestinal system: Abdomen is nondistended, soft and nontender. No organomegaly or masses felt. Normal bowel sounds heard. Central nervous system: Alert and oriented. No focal neurological deficits. Extremities: Symmetric 5 x 5 power. Skin: No rashes, lesions or  ulcers Psychiatry: Judgement and insight appear normal. Mood & affect appropriate.    Condition at discharge: good  The results of significant diagnostics from this hospitalization (including imaging, microbiology, ancillary and laboratory) are listed below for reference.   Imaging Studies: NM WBC SCAN TUMOR LOC LIMITED  Result Date: 12/03/2021 CLINICAL DATA:  Evaluate for infection of left kidney. Severe left-sided hydronephrosis. EXAM: NUCLEAR MEDICINE LEUKOCYTE SCAN TECHNIQUE: Following intravenous administration of radiolabeled white blood cells, images of the head, neck, trunk, and extremities were obtained on subsequent days. RADIOPHARMACEUTICALS:  392 microcuries Indium-111 labeled autologous leukocytes COMPARISON:  CT AP 11/29/2021 FINDINGS: On the early anterior posterior planar images there is a normal physiologic distribution of the radiopharmaceutical. On the 20/4 hour delayed images there is mild asymmetric radiotracer accumulation within the enlarged and markedly hydronephrotic left kidney. No additional abnormal areas of increased radiotracer uptake identified. IMPRESSION: 1. On the 24 hour delayed images there is asymmetric increased radiotracer accumulation localizing to the enlarged and hydronephrotic left kidney. Cannot exclude underlying inflammation/infection. Electronically Signed   By: Queen Slough.D.  On: 12/03/2021 14:52   ECHOCARDIOGRAM COMPLETE  Result Date: 12/02/2021    ECHOCARDIOGRAM REPORT   Patient Name:   Emerson Gow Date of Exam: 12/01/2021 Medical Rec #:  [id number]        Height:       60.0 in Accession #:    [id number]       Weight:       115.0 lb Date of Birth:  04-19-1945        BSA:          1.475 m Patient Age:    44 years         BP:           113/72 mmHg Patient Gender: F                HR:           70 bpm. Exam Location:  [facility] Procedure: 2D Echo Indications:     Endocarditis I38  History:         Patient has no prior history of Echocardiogram  examinations.  Sonographer:     Kathlen Brunswick RDCS Referring Phys:  [id number] Sharen Hones Diagnosing Phys: Yolonda Kida MD IMPRESSIONS  1. Left ventricular ejection fraction, by estimation, is 55 to 60%. The left ventricle has normal function. The left ventricle has no regional wall motion abnormalities. Left ventricular diastolic parameters are consistent with Grade I diastolic dysfunction (impaired relaxation).  2. Right ventricular systolic function is normal. The right ventricular size is normal.  3. The mitral valve is normal in structure. Trivial mitral valve regurgitation.  4. The aortic valve is normal in structure. Aortic valve regurgitation is not visualized. FINDINGS  Left Ventricle: Left ventricular ejection fraction, by estimation, is 55 to 60%. The left ventricle has normal function. The left ventricle has no regional wall motion abnormalities. The left ventricular internal cavity size was normal in size. There is  no left ventricular hypertrophy. Left ventricular diastolic parameters are consistent with Grade I diastolic dysfunction (impaired relaxation). Right Ventricle: The right ventricular size is normal. No increase in right ventricular wall thickness. Right ventricular systolic function is normal. Left Atrium: Left atrial size was normal in size. Right Atrium: Right atrial size was normal in size. Pericardium: There is no evidence of pericardial effusion. Mitral Valve: The mitral valve is normal in structure. Trivial mitral valve regurgitation. Tricuspid Valve: The tricuspid valve is normal in structure. Tricuspid valve regurgitation is not demonstrated. Aortic Valve: The aortic valve is normal in structure. Aortic valve regurgitation is not visualized. Aortic valve peak gradient measures 9.1 mmHg. Pulmonic Valve: The pulmonic valve was normal in structure. Pulmonic valve regurgitation is not visualized. Aorta: The ascending aorta was not well visualized. IAS/Shunts: No atrial level shunt  detected by color flow Doppler.  LEFT VENTRICLE PLAX 2D LVIDd:         3.60 cm     Diastology LVIDs:         2.50 cm     LV e' medial:    9.56 cm/s LV PW:         1.10 cm     LV E/e' medial:  7.4 LV IVS:        0.90 cm     LV e' lateral:   7.56 cm/s LVOT diam:     1.80 cm     LV E/e' lateral: 9.4 LV SV:         52 LV SV Index:   36  LVOT Area:     2.54 cm  LV Volumes (MOD) LV vol d, MOD A4C: 53.2 ml LV vol s, MOD A4C: 21.6 ml LV SV MOD A4C:     53.2 ml RIGHT VENTRICLE RV Basal diam:  3.00 cm RV S prime:     16.10 cm/s LEFT ATRIUM         Index       RIGHT ATRIUM          Index LA diam:    2.90 cm 1.97 cm/m  RA Area:     8.35 cm                                 RA Volume:   13.20 ml 8.95 ml/m  AORTIC VALVE                 PULMONIC VALVE AV Area (Vmax): 2.01 cm     PV Vmax:       0.71 m/s AV Vmax:        151.00 cm/s  PV Peak grad:  2.0 mmHg AV Peak Grad:   9.1 mmHg LVOT Vmax:      119.00 cm/s LVOT Vmean:     73.100 cm/s LVOT VTI:       0.206 m  AORTA Ao Root diam: 3.00 cm MITRAL VALVE MV Area (PHT): 3.59 cm    SHUNTS MV Decel Time: 212 msec    Systemic VTI:  0.21 m MV E velocity: 70.75 cm/s  Systemic Diam: 1.80 cm MV A velocity: 85.40 cm/s MV E/A ratio:  0.83 Dwayne D Callwood MD Electronically signed by Yolonda Kida MD Signature Date/Time: 12/02/2021/7:47:09 PM    Final    CT ABDOMEN PELVIS WO CONTRAST  Result Date: 11/29/2021 CLINICAL DATA:  Pyelonephritis, chronic renal insufficiency. EXAM: CT ABDOMEN AND PELVIS WITHOUT CONTRAST TECHNIQUE: Multidetector CT imaging of the abdomen and pelvis was performed following the standard protocol without IV contrast. RADIATION DOSE REDUCTION: This exam was performed according to the departmental dose-optimization program which includes automated exposure control, adjustment of the mA and/or kV according to patient size and/or use of iterative reconstruction technique. COMPARISON:  March 03, 2018 and previous imaging from 2015. Also there are prior images from June  of 2006. FINDINGS: Lower chest: Lung bases are clear. No effusion. No consolidative changes. Hepatobiliary: Smooth hepatic contours. No visible lesion. No pericholecystic stranding. No gross biliary duct distension. Pancreas: Pancreas with normal contours, no signs of inflammation or peripancreatic fluid. Spleen: Normal. Adrenals/Urinary Tract: Adrenal glands are normal. Marked LEFT hydronephrosis with complete loss of renal parenchyma. Layering small stones in calcification within dilated collecting systems of the chronically hydronephrotic LEFT kidney. Transition at the LEFT UPJ. Mild LEFT perinephric stranding is new however compared to previous imaging. Kidney measuring 11 x 8.7 cm as compared to 11 x 8.8 cm in the interpolar aspect of the LEFT kidney. 18-19 cm greatest craniocaudal dimension is also similar to previous imaging. Nephrolithiasis on the RIGHT with multiple intrarenal calculi similar to previous imaging. No RIGHT-sided ureteral calculi. No hydronephrosis on the RIGHT. Urinary bladder is partially collapsed without substantial adjacent stranding. Stomach/Bowel: Small hiatal hernia. No signs of bowel dilation or inflammation involving small bowel. No sign of obstruction of the colon. Ingested contrast passes into the proximal colon. Appendix not visualized but no secondary signs to suggest acute appendiceal process Vascular/Lymphatic: No abdominal aortic dilation. No frank adenopathy in the retroperitoneum  or in the pelvis. Reproductive: Unremarkable by CT. Other: No free air or ascites. Musculoskeletal: Osteopenia.  No acute or destructive bone process. IMPRESSION: 1. 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 Likely due to stricture based on previous imaging due to stone disease (imaging from 2009). Correlate with any signs of urinary tract infection. If there are worsening LEFT-sided symptoms the risk of  pyelonephritis complicated by sepsis due to chronic obstruction of the LEFT kidney may be higher than simple pyelonephritis. 2. Nephrolithiasis on the RIGHT with multiple intrarenal calculi similar to previous imaging. No RIGHT-sided ureteral calculi. 3. Small hiatal hernia. Electronically Signed   By: Zetta Bills M.D.   On: 11/29/2021 13:48   US RENAL  Result Date: 11/27/2021 CLINICAL DATA:  Pyelonephritis EXAM: RENAL / URINARY TRACT ULTRASOUND COMPLETE COMPARISON:  CT 03/03/2018, 02/20/2014 FINDINGS: Right Kidney: Renal measurements: 10.2 x 4.4 x 4.7 cm = volume: 109 mL. Echogenicity within normal limits. Multiple echogenic, shadowing stones within the right kidney, largest measuring up to 5 mm. No mass or hydronephrosis visualized. Left Kidney: Renal measurements: Approximately 19.7 x 12.1 x 9.3 cm = volume: 1,166 mL. Chronic severe hydronephrosis of the left kidney. No discernible renal cortex is visualized. Bladder: Appears normal for degree of bladder distention. Right ureteral jet was visualized. Other: None. IMPRESSION: 1. Right nephrolithiasis without evidence of obstructive uropathy. 2. Chronic severe hydronephrosis of the left kidney. 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 Electronically Signed   By: Davina Poke D.O.   On: 11/27/2021 17:41   DG Chest 2 View  Result Date: 11/27/2021 CLINICAL DATA:  Fever and cough. EXAM: CHEST - 2 VIEW COMPARISON:  None Available. FINDINGS: The heart size and mediastinal contours are within normal limits. There is no evidence of acute infiltrate, pleural effusion or pneumothorax. The visualized skeletal structures are unremarkable. IMPRESSION: No active cardiopulmonary disease. Electronically Signed   By: Virgina Norfolk M.D.   On: 11/27/2021 00:54    Microbiology: Results for orders placed or performed during the hospital encounter of 11/26/21  Resp Panel by RT-PCR (Flu A&B, Covid) Anterior Nasal Swab      Status: None   Collection Time: 11/27/21 12:06 AM   Specimen: Anterior Nasal Swab  Result Value Ref Range Status   SARS Coronavirus 2 by RT PCR NEGATIVE NEGATIVE Final    Comment: (NOTE) SARS-CoV-2 target nucleic acids are NOT DETECTED.  The SARS-CoV-2 RNA is generally detectable in upper respiratory specimens during the acute phase of infection. The lowest concentration of SARS-CoV-2 viral copies this assay can detect is 138 copies/mL. A negative result does not preclude SARS-Cov-2 infection and should not be used as the sole basis for treatment or other patient management decisions. A negative result may occur with  improper specimen collection/handling, submission of specimen other than nasopharyngeal swab, presence of viral mutation(s) within the areas targeted by this assay, and inadequate number of viral copies(<138 copies/mL). A negative result must be combined with clinical observations, patient history, and epidemiological information. The expected result is Negative.  Fact Sheet for Patients:  EntrepreneurPulse.com.au  Fact Sheet for Healthcare Providers:  IncredibleEmployment.be  This test is no t yet approved or cleared by the [place] FDA and  has been authorized for detection and/or diagnosis of SARS-CoV-2 by FDA under an Emergency Use Authorization (EUA). This EUA will remain  in effect (meaning this test can be used)  for the duration of the COVID-19 declaration under Section 564(b)(1) of the Act, 21 U.S.C.section 360bbb-3(b)(1), unless the authorization is terminated  or revoked sooner.       Influenza A by PCR NEGATIVE NEGATIVE Final   Influenza B by PCR NEGATIVE NEGATIVE Final    Comment: (NOTE) The Xpert Xpress SARS-CoV-2/FLU/RSV plus assay is intended as an aid in the diagnosis of influenza from Nasopharyngeal swab specimens and should not be used as a sole basis for treatment. Nasal washings and aspirates are  unacceptable for Xpert Xpress SARS-CoV-2/FLU/RSV testing.  Fact Sheet for Patients: EntrepreneurPulse.com.au  Fact Sheet for Healthcare Providers: IncredibleEmployment.be  This test is not yet approved or cleared by the [place] FDA and has been authorized for detection and/or diagnosis of SARS-CoV-2 by FDA under an Emergency Use Authorization (EUA). This EUA will remain in effect (meaning this test can be used) for the duration of the COVID-19 declaration under Section 564(b)(1) of the Act, 21 U.S.C. section 360bbb-3(b)(1), unless the authorization is terminated or revoked.  Performed at [facility], [address] [place], [place] [address]   Urine Culture     Status: None   Collection Time: 11/27/21 12:06 AM   Specimen: Urine, Clean Catch  Result Value Ref Range Status   Specimen Description   Final    URINE, CLEAN CATCH Performed at [facility], [address] [place], [place] [address]    Special Requests   Final    NONE Performed at [facility], [address], [place], [place] [address]    Culture   Final    NO GROWTH Performed at [facility], [place] [address], [place], [place] [address]    Report Status 11/28/2021 FINAL  Final  Culture, blood (Routine X 2) w Reflex to ID Panel     Status: None   Collection Time: 11/27/21  2:49 PM   Specimen: BLOOD  Result Value Ref Range Status   Specimen Description BLOOD BLOOD RIGHT HAND  Final   Special Requests   Final    BOTTLES DRAWN AEROBIC AND ANAEROBIC Blood Culture results may not be optimal due to an inadequate volume of blood received in culture bottles   Culture   Final    NO GROWTH 5 DAYS Performed at [facility], [address] [place], [place] [address]    Report Status 12/02/2021 FINAL  Final  Culture, blood (Routine X 2) w Reflex to ID Panel     Status: None   Collection Time: 11/27/21  2:49 PM   Specimen: BLOOD   Result Value Ref Range Status   Specimen Description BLOOD LEFT ANTECUBITAL  Final   Special Requests   Final    BOTTLES DRAWN AEROBIC AND ANAEROBIC Blood Culture adequate volume   Culture   Final    NO GROWTH 5 DAYS Performed at [facility], [address] [place], [place] [address]    Report Status 12/02/2021 FINAL  Final  Respiratory (~20 pathogens) panel by PCR     Status: None   Collection Time: 11/27/21  3:51 PM   Specimen: Nasopharyngeal Swab; Respiratory  Result Value Ref Range Status   Adenovirus NOT DETECTED NOT DETECTED Final   Coronavirus 229E NOT DETECTED NOT DETECTED Final    Comment: (NOTE) The Coronavirus on the Respiratory Panel, DOES NOT test for the novel  Coronavirus (2019 nCoV)    Coronavirus HKU1 NOT DETECTED NOT DETECTED Final   Coronavirus NL63 NOT DETECTED NOT DETECTED  Final   Coronavirus OC43 NOT DETECTED NOT DETECTED Final   Metapneumovirus NOT DETECTED NOT DETECTED Final   Rhinovirus / Enterovirus NOT DETECTED NOT DETECTED Final   Influenza A NOT DETECTED NOT DETECTED Final   Influenza B NOT DETECTED NOT DETECTED Final   Parainfluenza Virus 1 NOT DETECTED NOT DETECTED Final   Parainfluenza Virus 2 NOT DETECTED NOT DETECTED Final   Parainfluenza Virus 3 NOT DETECTED NOT DETECTED Final   Parainfluenza Virus 4 NOT DETECTED NOT DETECTED Final   Respiratory Syncytial Virus NOT DETECTED NOT DETECTED Final   Bordetella pertussis NOT DETECTED NOT DETECTED Final   Bordetella Parapertussis NOT DETECTED NOT DETECTED Final   Chlamydophila pneumoniae NOT DETECTED NOT DETECTED Final   Mycoplasma pneumoniae NOT DETECTED NOT DETECTED Final    Comment: Performed at [facility], [place] [address] [place], [place] [address]  Culture, blood (Routine X 2) w Reflex to ID Panel     Status: None (Preliminary result)   Collection Time: 11/30/21  2:08 PM   Specimen: BLOOD  Result Value Ref Range Status   Specimen Description BLOOD LEFT ARM  Final    Special Requests   Final    BOTTLES DRAWN AEROBIC AND ANAEROBIC Blood Culture adequate volume   Culture   Final    NO GROWTH 3 DAYS Performed at [facility], [address] [place], [place] [address]    Report Status PENDING  Incomplete  Culture, blood (Routine X 2) w Reflex to ID Panel     Status: None (Preliminary result)   Collection Time: 11/30/21  2:10 PM   Specimen: BLOOD  Result Value Ref Range Status   Specimen Description BLOOD RIGHT ARM  Final   Special Requests   Final    BOTTLES DRAWN AEROBIC AND ANAEROBIC Blood Culture adequate volume   Culture   Final    NO GROWTH 3 DAYS Performed at [facility], [address] [place], [place] [address]    Report Status PENDING  Incomplete    Labs: CBC: Recent Labs  Lab 11/26/21 1840 11/28/21 0340  WBC 8.5 5.4  HGB 13.4 11.7*  HCT 39.0 35.2*  MCV 94.0 96.2  PLT 189 378   Basic Metabolic Panel: Recent Labs  Lab 11/26/21 1840 11/28/21 0340 12/01/21 0648  NA 135 139 135  K 3.6 4.5 4.2  CL 104 111 105  CO2 21* 22 22  GLUCOSE 128* 105* 96  BUN '19 14 12  '$ CREATININE 1.36* 1.13* 0.96  CALCIUM 8.7* 7.7* 8.9  MG  --  1.9  --    Liver Function Tests: Recent Labs  Lab 11/26/21 1840 12/01/21 0648  AST 34 32  ALT 28 48*  ALKPHOS 39 58  BILITOT 1.7* 0.7  PROT 7.2 6.0*  ALBUMIN 3.5 2.6*   CBG: Recent Labs  Lab 11/30/21 0817  GLUCAP 97    Discharge time spent: greater than 30 minutes.  Signed: Sharen Hones, MD [facility] 12/03/2021

## 2021-12-03 NOTE — Progress Notes (Signed)
Discharge instructions reviewed with patient. PIVs removed with no complications. Patient states she has no further questions or concerns at this time. Will be leaving with a friend

## 2021-12-05 LAB — CULTURE, BLOOD (ROUTINE X 2)
Culture: NO GROWTH
Culture: NO GROWTH
Special Requests: ADEQUATE
Special Requests: ADEQUATE

## 2022-03-07 ENCOUNTER — Other Ambulatory Visit: Payer: Self-pay

## 2022-03-07 DIAGNOSIS — Z1231 Encounter for screening mammogram for malignant neoplasm of breast: Secondary | ICD-10-CM

## 2022-03-11 ENCOUNTER — Ambulatory Visit
Admission: RE | Admit: 2022-03-11 | Discharge: 2022-03-11 | Disposition: A | Discharge status: Home / Self Care | Payer: Medicare PPO | Source: Ambulatory Visit | Attending: Internal Medicine | Admitting: Internal Medicine

## 2022-03-11 DIAGNOSIS — Z1231 Encounter for screening mammogram for malignant neoplasm of breast: Secondary | ICD-10-CM | POA: Insufficient documentation

## 2023-01-26 ENCOUNTER — Other Ambulatory Visit: Payer: Self-pay | Admitting: Internal Medicine

## 2023-01-26 DIAGNOSIS — Z1231 Encounter for screening mammogram for malignant neoplasm of breast: Secondary | ICD-10-CM

## 2023-03-13 ENCOUNTER — Ambulatory Visit
Admission: RE | Admit: 2023-03-13 | Discharge: 2023-03-13 | Disposition: A | Discharge status: Home / Self Care | Payer: Medicare PPO | Source: Ambulatory Visit | Attending: Internal Medicine | Admitting: Internal Medicine

## 2023-03-13 DIAGNOSIS — Z1231 Encounter for screening mammogram for malignant neoplasm of breast: Secondary | ICD-10-CM | POA: Insufficient documentation

## 2024-02-26 ENCOUNTER — Other Ambulatory Visit: Payer: Self-pay | Admitting: Internal Medicine

## 2024-02-26 DIAGNOSIS — Z1231 Encounter for screening mammogram for malignant neoplasm of breast: Secondary | ICD-10-CM

## 2024-03-17 ENCOUNTER — Ambulatory Visit
Admission: RE | Admit: 2024-03-17 | Discharge: 2024-03-17 | Disposition: A | Discharge status: Home / Self Care | Source: Ambulatory Visit | Attending: Internal Medicine | Admitting: Internal Medicine

## 2024-03-17 DIAGNOSIS — Z1231 Encounter for screening mammogram for malignant neoplasm of breast: Secondary | ICD-10-CM | POA: Diagnosis present
